# Patient Record
Sex: Female | Born: 1985
Health system: Southern US, Community
[De-identification: ages and names within clinical notes are randomized; demographics above are authoritative.]

## PROBLEM LIST (undated history)

## (undated) ENCOUNTER — Inpatient Hospital Stay (HOSPITAL_COMMUNITY): Payer: Self-pay

## (undated) DIAGNOSIS — Z87440 Personal history of urinary (tract) infections: Secondary | ICD-10-CM

## (undated) DIAGNOSIS — Z87442 Personal history of urinary calculi: Secondary | ICD-10-CM

## (undated) DIAGNOSIS — Z87448 Personal history of other diseases of urinary system: Secondary | ICD-10-CM

## (undated) DIAGNOSIS — B379 Candidiasis, unspecified: Secondary | ICD-10-CM

## (undated) DIAGNOSIS — F419 Anxiety disorder, unspecified: Secondary | ICD-10-CM

## (undated) DIAGNOSIS — Z8619 Personal history of other infectious and parasitic diseases: Secondary | ICD-10-CM

## (undated) HISTORY — DX: Personal history of urinary calculi: Z87.442

## (undated) HISTORY — PX: TONSILLECTOMY: SUR1361

## (undated) HISTORY — DX: Personal history of urinary (tract) infections: Z87.440

## (undated) HISTORY — DX: Anxiety disorder, unspecified: F41.9

## (undated) HISTORY — PX: WISDOM TOOTH EXTRACTION: SHX21

## (undated) HISTORY — DX: Personal history of other diseases of urinary system: Z87.448

## (undated) HISTORY — DX: Personal history of other infectious and parasitic diseases: Z86.19

## (undated) HISTORY — DX: Candidiasis, unspecified: B37.9

---
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
## 2010-05-23 NOTE — Op Note (Signed)
NAMEBLESSINGS, INGLETT               ACCOUNT NO.:  [id number]   MEDICAL RECORD NO.:  [id number]          PATIENT TYPE:  AMB   LOCATION:  DSC                          FACILITY:  [facility]   PHYSICIAN:  Onalee Hua L. Annalee Genta, M.D.DATE OF BIRTH:  1985-06-17   DATE OF PROCEDURE:  12/23/2006  DATE OF DISCHARGE:                               OPERATIVE REPORT   PRE AND POSTOPERATIVE DIAGNOSIS AND INDICATIONS FOR SURGERY:  1. Recurrent tonsillitis.  2. Tonsillar hypertrophy.  3. Chronic cryptic tonsillitis.   SURGICAL PROCEDURES:  Tonsillectomy.   ANESTHESIA:  General endotracheal.   SURGEON:  Kinnie Scales. Annalee Genta, M.D.   COMPLICATIONS:  None.   BLOOD LOSS:  Minimal.   The patient transferred from the operating room to recovery room in  stable condition.   BRIEF HISTORY:  Ersa is a 25 year old white female who is referred for  evaluation of recurrent tonsillitis.  The patient has had significant  problems with recurrent infections and has chronic tonsillar discharge,  halitosis and sore throat.  The patient has been treated with multiple  rounds of antibiotics over the last several years for recurrent  streptococcal tonsillitis.  Given her history and examination, I  recommended that we undertake tonsillectomy under general anesthesia.  The risks, benefits and possible complications of procedure were  discussed in detail with the patient and her mother and they understood  and concurred with our plan for surgery which was scheduled for December 23, 2006.   PROCEDURE:  The patient left the operating room on December 23, 2006 at  [facility] day [facility] center.  Placed in supine position on  the operating table.  General endotracheal anesthesia was established  without difficulty.  When the patient was adequately anesthetized, her  oral cavity, oropharynx were examined.  There were no loose or broken  teeth and the hard and soft palate were intact.  Crowe-Davis mouth gag  was  inserted without difficulty.  Surgical procedure was begun with  inspection of the nasopharynx.  There was no significant adenoidal  tissue and adenoidectomy was not performed.  Tonsillectomy was then  begun using Bovie electrocautery and dissecting in subcapsular fashion  along the left-hand side the entire left tonsil removed from superior  pole to tongue base.  The tonsil was removed in similar fashion and the  tonsillar fossa were gently abraded with a dry tonsil sponge.  A small  area of a point hemorrhage was cauterized with suction cautery.  The  oral cavity, oropharynx were irrigated and suctioned.  Crowe-Davis mouth  gag was released and reapplied.  There is no active bleeding.  An  orogastric tube was passed.  The stomach contents were aspirated.  Mouth  gag was released and removed.  No loose or broken teeth.  The patient  was awakened from anesthetic, extubated, was transferred from the  operating room to recovery in stable condition.  No complications.  Blood loss minimal.           ______________________________  Kinnie Scales. Annalee Genta, M.D.     DLS/MEDQ  D:  04/54/0981  T:  12/24/2006  Job:  [id number]

## 2010-10-13 LAB — I-STAT 8, (EC8 V) (CONVERTED LAB)
Acid-Base Excess: 2
BUN: 9
Bicarbonate: 26.3 — ABNORMAL HIGH
Chloride: 102
Glucose, Bld: 92
HCT: 40
Hemoglobin: 13.6
Operator id: 146091
Potassium: 4.2
Sodium: 135
TCO2: 27
pCO2, Ven: 37.7 — ABNORMAL LOW
pH, Ven: 7.451 — ABNORMAL HIGH

## 2010-10-13 LAB — URINALYSIS, ROUTINE W REFLEX MICROSCOPIC
Bilirubin Urine: NEGATIVE
Glucose, UA: NEGATIVE
Hgb urine dipstick: NEGATIVE
Ketones, ur: >80 — AB
Nitrite: NEGATIVE
Protein, ur: NEGATIVE
Specific Gravity, Urine: 1.026
Urobilinogen, UA: 1
pH: 7.5

## 2010-10-13 LAB — CBC
HCT: 35.8 — ABNORMAL LOW
Hemoglobin: 12.3
MCHC: 34.4
MCV: 86.9
Platelets: 200
RBC: 4.12
RDW: 12.5
WBC: 15.8 — ABNORMAL HIGH

## 2010-10-13 LAB — POCT I-STAT CREATININE
Creatinine, Ser: 0.9
Operator id: 146091

## 2010-10-13 LAB — DIFFERENTIAL
Basophils Absolute: 0.1
Basophils Relative: 0
Eosinophils Absolute: 0 — ABNORMAL LOW
Eosinophils Relative: 0
Lymphocytes Relative: 5 — ABNORMAL LOW
Lymphs Abs: 0.8
Monocytes Absolute: 1.2 — ABNORMAL HIGH
Monocytes Relative: 8
Neutro Abs: 13.7 — ABNORMAL HIGH
Neutrophils Relative %: 87 — ABNORMAL HIGH

## 2010-10-13 LAB — PREGNANCY, URINE: Preg Test, Ur: NEGATIVE

## 2010-10-13 LAB — POCT HEMOGLOBIN-HEMACUE
Hemoglobin: 14.1
Operator id: 123881

## 2011-01-09 NOTE — L&D Delivery Note (Signed)
Delivery Note Cx complete and +1 around 2045, and pushing started around 2048.  About half hr into pushing, intermittent variables w/ late component noted, and had pt push w/ some ctxs, and rest through others, along w/ varying tilted and lateral positions.  Pt pushed well to SVD at 11:09 PM.  A viable female "Elizabeth Garrison," was delivered via Vaginal, Spontaneous Delivery (Presentation: Right Occiput Anterior).  APGAR: 9, 9; weight 8 lb 8.9 oz (3880 g).  Meconium at delivery thicker, than noted while pushing.  No difficulty w/ shoulders, but body dystocia noted.  Poor tone as placed on mom's abdomen, but as dried and stimulated, newborn w/ lusty cry immediately, and cont'd transitioning well thereafter. Placenta status: Intact, Spontaneous, partial Duncan.  Cord: 3 vessels with the following complications: None.  Cord pH: not done.  Anesthesia: Epidural  Episiotomy: None Lacerations: bilateral Labial Suture Repair: 3-0 & 4-0 monocryl one stitch on Lt; 6 stitches on Rt (wider laceration than Lt) Est. Blood Loss (mL): 350  Mom to postpartum.  Baby to nursery-stable.  Samadhi Mahurin H 09/02/2011, 12:48 AM

## 2011-02-08 DIAGNOSIS — J45909 Unspecified asthma, uncomplicated: Secondary | ICD-10-CM | POA: Insufficient documentation

## 2011-02-08 DIAGNOSIS — F429 Obsessive-compulsive disorder, unspecified: Secondary | ICD-10-CM | POA: Insufficient documentation

## 2011-02-08 DIAGNOSIS — O09299 Supervision of pregnancy with other poor reproductive or obstetric history, unspecified trimester: Secondary | ICD-10-CM | POA: Insufficient documentation

## 2011-02-08 DIAGNOSIS — N209 Urinary calculus, unspecified: Secondary | ICD-10-CM | POA: Insufficient documentation

## 2011-02-08 LAB — OB RESULTS CONSOLE RUBELLA ANTIBODY, IGM: Rubella: IMMUNE

## 2011-02-08 LAB — OB RESULTS CONSOLE HEPATITIS B SURFACE ANTIGEN: Hepatitis B Surface Ag: NEGATIVE

## 2011-02-08 LAB — OB RESULTS CONSOLE RPR: RPR: NONREACTIVE

## 2011-02-08 LAB — OB RESULTS CONSOLE HIV ANTIBODY (ROUTINE TESTING): HIV: NONREACTIVE

## 2011-02-08 LAB — OB RESULTS CONSOLE ABO/RH: RH Type: POSITIVE

## 2011-02-08 LAB — OB RESULTS CONSOLE ANTIBODY SCREEN: Antibody Screen: NEGATIVE

## 2011-03-09 ENCOUNTER — Encounter (INDEPENDENT_AMBULATORY_CARE_PROVIDER_SITE_OTHER): Payer: Self-pay

## 2011-03-09 DIAGNOSIS — Z331 Pregnant state, incidental: Secondary | ICD-10-CM

## 2011-04-05 ENCOUNTER — Encounter (INDEPENDENT_AMBULATORY_CARE_PROVIDER_SITE_OTHER): Payer: BC Managed Care – PPO | Admitting: Obstetrics and Gynecology

## 2011-04-05 ENCOUNTER — Encounter (INDEPENDENT_AMBULATORY_CARE_PROVIDER_SITE_OTHER): Payer: BC Managed Care – PPO

## 2011-04-05 DIAGNOSIS — N390 Urinary tract infection, site not specified: Secondary | ICD-10-CM

## 2011-04-05 DIAGNOSIS — Z363 Encounter for antenatal screening for malformations: Secondary | ICD-10-CM

## 2011-04-05 DIAGNOSIS — Z331 Pregnant state, incidental: Secondary | ICD-10-CM

## 2011-04-05 DIAGNOSIS — Z1389 Encounter for screening for other disorder: Secondary | ICD-10-CM

## 2011-04-29 ENCOUNTER — Telehealth: Payer: Self-pay | Admitting: Advanced Practice Midwife

## 2011-04-29 NOTE — Telephone Encounter (Signed)
[redacted] weeks gestation. Reports generalized low abd pain right-sided LBP and sharp midline low abd pain since this morning. Moderate physical labor yesterday. She denies contractions, vaginal bleeding or leaking of fluid or Hx PTL/PTD. PTL precautions. Increase fluids, rest and take Ibuprofen 600 mg. CB if no improvement in 2-3 hours.

## 2011-04-30 ENCOUNTER — Telehealth: Payer: Self-pay | Admitting: Obstetrics and Gynecology

## 2011-04-30 NOTE — Telephone Encounter (Signed)
Routed to keshia °

## 2011-04-30 NOTE — Telephone Encounter (Signed)
PT CALLED IS 22 WKS, C/O ABD CRAMPINGTHAT STARTED YESTERDAY, HAVING PAIN THAT CAME IN INTERVALS, WAS NOT REGULAR, SO POSSIBLY BH CTXS, THEN THEY WENT AWAY, THIS AM WOKE UP, STARTED WITH CRAMPING AGAIN, STATES WAS UNCOMFORTABLE TO WALK, DENIES BLDG/LOF, PT ADMITS TO NOT DRINKING ENOUGH H20 AND NO MEDS OR COMFORT MEASURES TAKEN.  PT ADVISED TO PUSH FLUIDS AND CONTINUE DRINKING THROUGHOUT THE DAY, TRY TAKING IBUPROREN 600 MG Q6HRS OR TAKING TYLENOL AS DIRECTED, ALSO TRY TAKING WARM BATH, IF NO RELIEF AFTER THE AM, TO CALL BACK FOR APPT IN OFFICE, PT VOICES UNDERSTANDING.

## 2011-05-02 ENCOUNTER — Other Ambulatory Visit: Payer: Self-pay

## 2011-05-02 DIAGNOSIS — Z331 Pregnant state, incidental: Secondary | ICD-10-CM

## 2011-05-02 DIAGNOSIS — F429 Obsessive-compulsive disorder, unspecified: Secondary | ICD-10-CM

## 2011-05-02 DIAGNOSIS — O09299 Supervision of pregnancy with other poor reproductive or obstetric history, unspecified trimester: Secondary | ICD-10-CM

## 2011-05-02 DIAGNOSIS — N209 Urinary calculus, unspecified: Secondary | ICD-10-CM

## 2011-05-03 ENCOUNTER — Other Ambulatory Visit: Payer: BC Managed Care – PPO

## 2011-05-03 ENCOUNTER — Ambulatory Visit (INDEPENDENT_AMBULATORY_CARE_PROVIDER_SITE_OTHER): Payer: BC Managed Care – PPO

## 2011-05-03 ENCOUNTER — Ambulatory Visit (INDEPENDENT_AMBULATORY_CARE_PROVIDER_SITE_OTHER): Payer: BC Managed Care – PPO | Admitting: Obstetrics and Gynecology

## 2011-05-03 ENCOUNTER — Encounter: Payer: Self-pay | Admitting: Obstetrics and Gynecology

## 2011-05-03 ENCOUNTER — Other Ambulatory Visit: Payer: Self-pay | Admitting: Obstetrics and Gynecology

## 2011-05-03 ENCOUNTER — Encounter: Payer: BC Managed Care – PPO | Admitting: Obstetrics and Gynecology

## 2011-05-03 VITALS — BP 112/60 | Ht 63.0 in | Wt 133.0 lb

## 2011-05-03 DIAGNOSIS — Z331 Pregnant state, incidental: Secondary | ICD-10-CM

## 2011-05-03 LAB — US OB FOLLOW UP

## 2011-05-03 NOTE — Progress Notes (Signed)
Doing well. [place]: SIUP, Nl fluid, 23 4/7 (72%), Nl anatomy. Questions and concerns addressed. RTO 4 weeks.  AVS

## 2011-05-03 NOTE — Progress Notes (Signed)
pt c/o: contractions on Sunday and Monday that were apart   c/o:  a lot of pain and pressure so much pain that it was hard for her to walk or stand. Pt is concerned about weight gain. She is unsure about how much she should gain.

## 2011-05-14 ENCOUNTER — Telehealth: Payer: Self-pay | Admitting: Obstetrics and Gynecology

## 2011-05-14 NOTE — Telephone Encounter (Signed)
Triage received 

## 2011-05-14 NOTE — Telephone Encounter (Signed)
TC to pt. States is having increased sx of anxiety and OCD.   Was on medication but pt D/C'd when became pregnant.  States was told by psychiatrist could continue medication but wants to discuss with OB.  Denies suicidal or homicidal thoughts. Sched to discuss w/VL 05/15/11.

## 2011-05-15 ENCOUNTER — Ambulatory Visit (INDEPENDENT_AMBULATORY_CARE_PROVIDER_SITE_OTHER): Payer: BC Managed Care – PPO | Admitting: Obstetrics and Gynecology

## 2011-05-15 ENCOUNTER — Encounter: Payer: Self-pay | Admitting: Obstetrics and Gynecology

## 2011-05-15 VITALS — BP 100/64 | Wt 135.0 lb

## 2011-05-15 DIAGNOSIS — F329 Major depressive disorder, single episode, unspecified: Secondary | ICD-10-CM

## 2011-05-15 DIAGNOSIS — F489 Nonpsychotic mental disorder, unspecified: Secondary | ICD-10-CM

## 2011-05-15 DIAGNOSIS — F32A Depression, unspecified: Secondary | ICD-10-CM

## 2011-05-15 DIAGNOSIS — O99340 Other mental disorders complicating pregnancy, unspecified trimester: Secondary | ICD-10-CM

## 2011-05-15 MED ORDER — SERTRALINE HCL 25 MG PO TABS: 25.0000 mg | ORAL_TABLET | Freq: Every day | ORAL | Status: DC

## 2011-05-15 NOTE — Progress Notes (Signed)
Increased anxiety/depression--previous use of Prozac prior to pregnancy, elected to stop with pregnancy. Had been followed by psychiatrist, Dr. Tomasa Rand, but patient not happy with care. Issues reviewed.  Patient requests we start her on "something for her nerves and OCD".  Declines referral back to Dr. Tomasa Rand or other psychiatrist at present. Plan: Will start Zoloft 25 mg po q day x 7 days, then increase to 50 mg po q day.   Will re-evalute status at [facility] in 2 weeks.  If Zoloft appears ineffective, or if symptoms worsen, I advised patient we would need to refer at that time to psych provider.  Patient agreeable with that plan.

## 2011-05-15 NOTE — Progress Notes (Signed)
Pt requests medication for increased OCD & anxiety sx; no void yet drink given

## 2011-06-01 ENCOUNTER — Ambulatory Visit (INDEPENDENT_AMBULATORY_CARE_PROVIDER_SITE_OTHER): Payer: BC Managed Care – PPO | Admitting: Obstetrics and Gynecology

## 2011-06-01 ENCOUNTER — Encounter: Payer: Self-pay | Admitting: Obstetrics and Gynecology

## 2011-06-01 VITALS — BP 100/70 | Wt 138.0 lb

## 2011-06-01 DIAGNOSIS — Z331 Pregnant state, incidental: Secondary | ICD-10-CM

## 2011-06-01 LAB — HEMOGLOBIN: Hemoglobin: 11.3 g/dL — ABNORMAL LOW (ref 12.0–15.0)

## 2011-06-01 NOTE — Progress Notes (Signed)
[redacted]w[redacted]d [facility]  1hr today Denies ctx rec CBE, BF,   Pt declined to start zoloft, wants to begin PP, tho Pt states sx's of OCD are stable, has support at home, "knows what to do"  Will reevaluate NV

## 2011-06-01 NOTE — Progress Notes (Signed)
1 gtt given today without difficulty  

## 2011-06-01 NOTE — Patient Instructions (Signed)
Fetal Movement Counts Patient Name: __________________________________________________ Patient Due Date: ____________________ Kick counts is highly recommended in high risk pregnancies, but it is a good idea for every pregnant woman to do. Start counting fetal movements at 28 weeks of the pregnancy. Fetal movements increase after eating a full meal or eating or drinking something sweet (the blood sugar is higher). It is also important to drink plenty of fluids (well hydrated) before doing the count. Lie on your left side because it helps with the circulation or you can sit in a comfortable chair with your arms over your belly (abdomen) with no distractions around you. DOING THE COUNT  Try to do the count the same time of day each time you do it.   Mark the day and time, then see how long it takes for you to feel 10 movements (kicks, flutters, swishes, rolls). You should have at least 10 movements within 2 hours. You will most likely feel 10 movements in much less than 2 hours. If you do not, wait an hour and count again. After a couple of days you will see a pattern.   What you are looking for is a change in the pattern or not enough counts in 2 hours. Is it taking longer in time to reach 10 movements?  SEEK MEDICAL CARE IF:  You feel less than 10 counts in 2 hours. Tried twice.   No movement in one hour.   The pattern is changing or taking longer each day to reach 10 counts in 2 hours.   You feel the baby is not moving as it usually does.  Date: ____________ Movements: ____________ Start time: ____________ Finish time: ____________  Date: ____________ Movements: ____________ Start time: ____________ Finish time: ____________ Date: ____________ Movements: ____________ Start time: ____________ Finish time: ____________ Date: ____________ Movements: ____________ Start time: ____________ Finish time: ____________ Date: ____________ Movements: ____________ Start time: ____________ Finish time:  ____________ Date: ____________ Movements: ____________ Start time: ____________ Finish time: ____________ Date: ____________ Movements: ____________ Start time: ____________ Finish time: ____________ Date: ____________ Movements: ____________ Start time: ____________ Finish time: ____________  Date: ____________ Movements: ____________ Start time: ____________ Finish time: ____________ Date: ____________ Movements: ____________ Start time: ____________ Finish time: ____________ Date: ____________ Movements: ____________ Start time: ____________ Finish time: ____________ Date: ____________ Movements: ____________ Start time: ____________ Finish time: ____________ Date: ____________ Movements: ____________ Start time: ____________ Finish time: ____________ Date: ____________ Movements: ____________ Start time: ____________ Finish time: ____________ Date: ____________ Movements: ____________ Start time: ____________ Finish time: ____________  Date: ____________ Movements: ____________ Start time: ____________ Finish time: ____________ Date: ____________ Movements: ____________ Start time: ____________ Finish time: ____________ Date: ____________ Movements: ____________ Start time: ____________ Finish time: ____________ Date: ____________ Movements: ____________ Start time: ____________ Finish time: ____________ Date: ____________ Movements: ____________ Start time: ____________ Finish time: ____________ Date: ____________ Movements: ____________ Start time: ____________ Finish time: ____________ Date: ____________ Movements: ____________ Start time: ____________ Finish time: ____________  Date: ____________ Movements: ____________ Start time: ____________ Finish time: ____________ Date: ____________ Movements: ____________ Start time: ____________ Finish time: ____________ Date: ____________ Movements: ____________ Start time: ____________ Finish time: ____________ Date: ____________ Movements:  ____________ Start time: ____________ Finish time: ____________ Date: ____________ Movements: ____________ Start time: ____________ Finish time: ____________ Date: ____________ Movements: ____________ Start time: ____________ Finish time: ____________ Date: ____________ Movements: ____________ Start time: ____________ Finish time: ____________  Date: ____________ Movements: ____________ Start time: ____________ Finish time: ____________ Date: ____________ Movements: ____________ Start time: ____________ Finish time: ____________ Date: ____________ Movements: ____________ Start time:   ____________ Finish time: ____________ Date: ____________ Movements: ____________ Start time: ____________ Finish time: ____________ Date: ____________ Movements: ____________ Start time: ____________ Finish time: ____________ Date: ____________ Movements: ____________ Start time: ____________ Finish time: ____________ Date: ____________ Movements: ____________ Start time: ____________ Finish time: ____________  Date: ____________ Movements: ____________ Start time: ____________ Finish time: ____________ Date: ____________ Movements: ____________ Start time: ____________ Finish time: ____________ Date: ____________ Movements: ____________ Start time: ____________ Finish time: ____________ Date: ____________ Movements: ____________ Start time: ____________ Finish time: ____________ Date: ____________ Movements: ____________ Start time: ____________ Finish time: ____________ Date: ____________ Movements: ____________ Start time: ____________ Finish time: ____________ Date: ____________ Movements: ____________ Start time: ____________ Finish time: ____________  Date: ____________ Movements: ____________ Start time: ____________ Finish time: ____________ Date: ____________ Movements: ____________ Start time: ____________ Finish time: ____________ Date: ____________ Movements: ____________ Start time: ____________ Finish  time: ____________ Date: ____________ Movements: ____________ Start time: ____________ Finish time: ____________ Date: ____________ Movements: ____________ Start time: ____________ Finish time: ____________ Date: ____________ Movements: ____________ Start time: ____________ Finish time: ____________ Date: ____________ Movements: ____________ Start time: ____________ Finish time: ____________  Date: ____________ Movements: ____________ Start time: ____________ Finish time: ____________ Date: ____________ Movements: ____________ Start time: ____________ Finish time: ____________ Date: ____________ Movements: ____________ Start time: ____________ Finish time: ____________ Date: ____________ Movements: ____________ Start time: ____________ Finish time: ____________ Date: ____________ Movements: ____________ Start time: ____________ Finish time: ____________ Date: ____________ Movements: ____________ Start time: ____________ Finish time: ____________ Document Released: 01/24/2006 Document Revised: 12/14/2010 Document Reviewed: 07/27/2008 ExitCare Patient Information 2012 [employer].  Preventing Preterm Labor Preterm labor is when a pregnant woman has contractions that cause the cervix to open, shorten, and thin before 37 weeks of pregnancy. You will have regular contractions (tightening) 2 to 3 minutes apart. This usually causes discomfort or pain. HOME CARE  Eat a healthy diet.   Take your vitamins as told by your doctor.   Drink enough fluids to keep your pee (urine) clear or pale yellow every day.   Get rest and sleep.   Do not have sex if you are at high risk for preterm labor.   Follow your doctor's advice about activity, medicines, and tests.   Avoid stress.   Avoid hard labor or exercise that lasts for a long time.   Do not smoke.  GET HELP RIGHT AWAY IF:   You are having contractions.   You have belly (abdominal) pain.   You have bleeding from your vagina.   You  have pain when you pee (urinate).   You have abnormal discharge from your vagina.   You have a temperature by mouth above 102 F (38.9 C).  MAKE SURE YOU:  Understand these instructions.   Will watch your condition.   Will get help if you are not doing well or get worse.  Document Released: 03/23/2008 Document Revised: 12/14/2010 Document Reviewed: 03/23/2008 Exit[employer] Patient Information 2012 [employer]. 

## 2011-06-02 LAB — RPR

## 2011-06-02 LAB — GLUCOSE TOLERANCE, 1 HOUR: Glucose, 1 Hour GTT: 74 mg/dL (ref 70–140)

## 2011-06-12 ENCOUNTER — Encounter: Payer: Self-pay | Admitting: Obstetrics and Gynecology

## 2011-06-12 ENCOUNTER — Ambulatory Visit (INDEPENDENT_AMBULATORY_CARE_PROVIDER_SITE_OTHER): Payer: BC Managed Care – PPO | Admitting: Obstetrics and Gynecology

## 2011-06-12 VITALS — BP 90/60 | Wt 138.5 lb

## 2011-06-12 DIAGNOSIS — Z331 Pregnant state, incidental: Secondary | ICD-10-CM

## 2011-06-12 NOTE — Progress Notes (Signed)
C/o itching ankles  C/o "[place]orses" both legs at night

## 2011-06-12 NOTE — Progress Notes (Signed)
[redacted]w[redacted]d 1hr=74, RPR NR, hgb 11.3 Light sunburn on legs, c/o leg cramps, rec cal/mag supp, epsom salt baths Discussed diet, has had decreased appetite lately, is taking CPA  Exam this week  rv'd [facility]KC and normal FM rv'd PTL sx's

## 2011-06-12 NOTE — Patient Instructions (Signed)
High Protein Diet A high protein diet means that high protein foods are added to your diet. Getting more protein in the diet is important for a number of reasons. Protein helps the body to build tissue, muscle, and to repair damage. People who have had surgery, injuries such as broken bones, infections, and burns, or illnesses such as cancer, may need more protein in their diet.  SERVING SIZES Measuring foods and serving sizes helps to make sure you are getting the right amount of food. The list below tells how big or small some common serving sizes are.   1 oz.........4 stacked dice.   3 oz.........Deck of cards.   1 tsp........Tip of little finger.   1 tbs........Thumb.   2 tbs........Golf ball.    cup.......Half of a fist.   1 cup........A fist.  FOOD SOURCES OF PROTEIN Listed below are some food sources of protein and the amount of protein they contain. Your Registered Dietitian can calculate how many grams of protein you need for your medical condition. High protein foods can be added to the diet at mealtime or as snacks. Be sure to have at least 1 protein-containing food at each meal and snack to ensure adequate intake.  Meats and Meat Substitutes / Protein (g)  3 oz poultry (chicken, turkey) / 26 g   3 oz tuna, canned in water / 26 g   3 oz fish (cod) / 21 g   3 oz red meat (beef, pork) / 21 g   4 oz tofu / 9 g   1 egg / 6 g    cup egg substitute / 5 g   1 cup dried beans / 15 g   1 cup soy milk / 4 g  Dairy / Protein (g)  1 cup milk (skim, 1%, 2%, whole) / 8 g    cup evaporated milk / 9 g   1 cup buttermilk / 8 g   1 cup low-fat plain yogurt / 11 g   1 cup regular plain yogurt / 9 g    cup cottage cheese / 14 g   1 oz cheddar cheese / 7 g  Nuts / Protein (g)  2 tbs peanut butter / 8 g   1 oz peanuts / 7 g   2 tbs cashews / 5 g   2 tbs almonds / 5 g  Document Released: 12/25/2004 Document Revised: 12/14/2010 Document Reviewed:  09/27/2006 Exit[employer] Patient Information 2012 [employer]. 

## 2011-06-25 ENCOUNTER — Ambulatory Visit (INDEPENDENT_AMBULATORY_CARE_PROVIDER_SITE_OTHER): Payer: BC Managed Care – PPO | Admitting: Obstetrics and Gynecology

## 2011-06-25 ENCOUNTER — Encounter: Payer: Self-pay | Admitting: Obstetrics and Gynecology

## 2011-06-25 VITALS — BP 102/62 | Wt 140.0 lb

## 2011-06-25 DIAGNOSIS — M79609 Pain in unspecified limb: Secondary | ICD-10-CM

## 2011-06-25 DIAGNOSIS — M79661 Pain in right lower leg: Secondary | ICD-10-CM

## 2011-06-25 NOTE — Progress Notes (Signed)
Pt states the pain on the right calf is worse and wakes her up at night. LE  Right calf tenderness.  2 plus DTR B, left calf WNL Schedule doppler of RLE today FKC reviewed RT 2 weeks Increase water intake

## 2011-06-25 NOTE — Progress Notes (Signed)
Pt c/o cramping in calves during the night.

## 2011-07-01 ENCOUNTER — Telehealth: Payer: Self-pay | Admitting: Obstetrics and Gynecology

## 2011-07-01 NOTE — Telephone Encounter (Signed)
Offered may come to [facility] for evaluation and states will do kick count and f/o at [facility] if withourt 10 fm over 2 hour.  Lavera Guise, CNM

## 2011-07-02 ENCOUNTER — Telehealth: Payer: Self-pay | Admitting: Obstetrics and Gynecology

## 2011-07-02 ENCOUNTER — Inpatient Hospital Stay (HOSPITAL_COMMUNITY)
Admission: AD | Admit: 2011-07-02 | Discharge: 2011-07-02 | Disposition: A | Discharge status: Hospice | Payer: BC Managed Care – PPO | Source: Ambulatory Visit | Attending: Obstetrics and Gynecology | Admitting: Obstetrics and Gynecology

## 2011-07-02 ENCOUNTER — Encounter (HOSPITAL_COMMUNITY): Payer: Self-pay

## 2011-07-02 DIAGNOSIS — N949 Unspecified condition associated with female genital organs and menstrual cycle: Secondary | ICD-10-CM | POA: Insufficient documentation

## 2011-07-02 DIAGNOSIS — O368190 Decreased fetal movements, unspecified trimester, not applicable or unspecified: Secondary | ICD-10-CM

## 2011-07-02 LAB — URINALYSIS, ROUTINE W REFLEX MICROSCOPIC
Bilirubin Urine: NEGATIVE
Glucose, UA: NEGATIVE mg/dL
Hgb urine dipstick: NEGATIVE
Ketones, ur: NEGATIVE mg/dL
Nitrite: NEGATIVE
Protein, ur: NEGATIVE mg/dL
Specific Gravity, Urine: <1.005 — ABNORMAL LOW (ref 1.005–1.030)
Urobilinogen, UA: 0.2 mg/dL (ref 0.0–1.0)
pH: 6 (ref 5.0–8.0)

## 2011-07-02 LAB — URINE MICROSCOPIC-ADD ON

## 2011-07-02 NOTE — Telephone Encounter (Signed)
Pam/ob

## 2011-07-02 NOTE — MAU Provider Note (Signed)
History   26 yo G1p0 at 41 1/7 weeks presented after calling the office with ? Decreased FM last night, now normalized, and sporadic, sharp pain near cervix.  Denies leaking, bleeding, dysuria, contractions, or cramping.    Pregnancy remarkable for: Patient Active Problem List  Diagnosis  . Asthma  . OCD (obsessive compulsive disorder)  . FH of NTD (neural tube defect), currently pregnant  . Urolithiasis  . Pregnant state, incidental     Chief Complaint  Patient presents with  . Decreased Fetal Movement  . Vaginal pressure     OB History    Grav Para Term Preterm Abortions TAB SAB Ect Mult Living   1 0 0 0 0 0 0 0 0 0       Past Medical History  Diagnosis Date  . H/O rubella   . H/O varicella   . Asthma 02/08/11  . H/O cystitis   . H/O pyelonephritis     as adolescent  . Anxiety     as a teen  . History of kidney stones     age 89  . Yeast infection     Past Surgical History  Procedure Date  . Tonsillectomy     age 51  . Wisdom tooth extraction age 20    Family History  Problem Relation Age of Onset  . Hypertension Mother   . Hypertension Maternal Aunt   . Hypertension Maternal Grandmother   . Cancer Maternal Grandfather   . Cancer Paternal Grandmother     History  Substance Use Topics  . Smoking status: Never Smoker   . Smokeless tobacco: Never Used  . Alcohol Use: No    Allergies: No Known Allergies  Prescriptions prior to admission  Medication Sig Dispense Refill  . Prenatal Vit-Fe Fumarate-FA (PRENATAL MULTIVITAMIN) TABS Take 1 tablet by mouth daily.         Physical Exam   Blood pressure 133/81, pulse 93, temperature 99.2 F (37.3 C), temperature source Oral, resp. rate 16, height 5\' 3"  (1.6 m), weight 141 lb 6 oz (64.127 kg), last menstrual period 11/18/2010.  Chest clear Heart RRR without murmur Abd gravid, NT Pelvic Ext WNL  EFM reactive, no decels No UCs in initial 10 minutes   ED Course  IUP at 31 1/7 weeks Likely normal  discomforts of pregnancy Good FM  Plan: Continue to observe tracing at present. Check cervix.  Nigel Bridgeman, CNM, [place] 07/02/11 3:50pm  Addendum: Reactive NST, with much FM now perceived by patient. No UCs. Cervix long, closed, vtx -2 Bedside US shows lots of FM, largely of the torso, with fetus in an anterior position  Plan: Patient reassured regarding status. Comfort measures and FKCs reviewed. Keep scheduled appointment on Friday or call prn.  Nigel Bridgeman, CNM, [place] 07/02/11 4:05p

## 2011-07-02 NOTE — Telephone Encounter (Signed)
Pt called regarding decrease fetal movement on last pm. Pt stated that she called CNM on call and was advised to wait a specific time and to call back. Pt stated that the baby started moving and she evaluated kick counts. Pt started having bad pains thereafter with every movement from the baby. The pain was bad enough for her to cry. There were no available appt's in the office today, CNM VL was called and she advised pt to to come to the hospital, Pt voice was understanding although she stated that the pain had gotten better but still felt as though she should be evaluated and her husband was concerned. Mathis Bud

## 2011-07-02 NOTE — Discharge Instructions (Signed)
Fetal Monitoring, Fetal Movement Assessment Fetal movement assessment (FMA) is done by the pregnant woman herself by counting and recording the baby's movements over a certain time period. It is done to see if there are problems with the pregnancy and the baby. Identifying and correcting problems may prevent serious problems from developing with the fetus, including fetal loss. Some pregnancies are complicated by the mother's medical problems. Some of these problems are type 1 diabetes mellitus, high blood pressure and other chronic medical illnesses. This is why it is important to monitor the baby before birth.  OTHER TECHNIQUES OF MONITORING YOUR BABY BEFORE BIRTH Several tests are in use. These include:  Nonstress test (NST). This test monitors the baby's heart rate when the baby moves.   Contraction stress test (CST). This test monitors the baby's heart rate during a contraction of the uterus.   Fetal biophysical profile (BPP). This measures and evaluates 5 observations of the baby:   The nonstress test.   The baby's breathing.   The baby's movements.   The baby's muscle tone.   The amount of amniotic fluid.   Modified BPP. This measures the volume of fluid in different parts of the amniotic sac (amniotic fluid index) and the results of the nonstress test.   Umbilical artery doppler velocimetry. This evaluates the blood flow through the umbilical cord.  There are several very serious problems that cannot be predicted or detected with any of the fetal monitoring procedures. These problems include separation (abruption) of the placenta or when the fetus chokes on the umbilical cord (umbilical cord accident). Your caregiver will help you understand your tests and what they mean for you and your baby. It is your responsibility to obtain the results of your test. LET YOUR CAREGIVER KNOW ABOUT:   Any medications you are taking including prescription and over-the-counter drugs, herbs, eye  drops and creams.   If you have a fever.   If you have an infection.   If you are sick.  RISKS AND COMPLICATIONS  There are no risks or complications to the mother or fetus with FMA. BEFORE THE PROCEDURE  Do not take medications that may decrease or increase the baby's heart rate and/or movements.   Eat a full meal at least 2 hours before the test.   Do not smoke if you are pregnant. If you smoke, stop at least 2 days before the test. It is best not to smoke at all when you are pregnant.  PROCEDURE Sometimes, a mother notices her baby moves less before there are problems. Because of this, it is believed that fetal movement checking by the mother (kick counts) is a good way to check the baby before birth. There are different ways of doing this. Two good ways are:  The woman lies on her side and counts distinct (individual) fetal movements. A feeling of 10 distinct movements in a period of up to 2 hours is considered reassuring. When 10 movements are felt, you may stop counting.   Women are instructed to count fetal movements for 1 hour, three times per week. The count is good if, after one week, it equals or is over the woman's previously established baseline count. If the count is lower, further checking of your baby is needed.  AFTER THE PROCEDURE You may resume your usual activities. HOME CARE INSTRUCTIONS   Follow your caregiver's advice and recommendations.   Be aware of your baby's movements. Are they normal, less than usual or more than usual?     Make and keep the rest of your prenatal appointments.  SEEK MEDICAL CARE IF:   You develop a temperature of 100 F (37.8 C) or higher.   You have a bloody mucus discharge from the vagina (a bloody show).  SEEK IMMEDIATE MEDICAL CARE IF:   You do not feel the baby move.   You think the baby's movements are too little or too many.   You develop contractions.   You develop vaginal bleeding.   You have belly (abdominal) pain.    You have leaking or a gush of fluid from the vagina.  Document Released: 12/15/2001 Document Revised: 12/14/2010 Document Reviewed: 04/19/2008 Exit[employer] Patient Information 2012 [employer]. 

## 2011-07-02 NOTE — MAU Note (Signed)
When pt was placed on efm by another provider, the OBIX was not initiated properly and tracing was not recorded; paper tracing saved and to be archived from 1543 until 1557;

## 2011-07-02 NOTE — MAU Note (Signed)
Pt states last pm was having strong "cervical" pain. Did have decreased fm, now having +fm. Denies bleeding, did have to clench her body tight to not void on herself.

## 2011-07-06 ENCOUNTER — Encounter: Payer: BC Managed Care – PPO | Admitting: Obstetrics and Gynecology

## 2011-07-10 ENCOUNTER — Encounter: Payer: Self-pay | Admitting: Obstetrics and Gynecology

## 2011-07-10 ENCOUNTER — Ambulatory Visit (INDEPENDENT_AMBULATORY_CARE_PROVIDER_SITE_OTHER): Payer: BC Managed Care – PPO | Admitting: Obstetrics and Gynecology

## 2011-07-10 VITALS — BP 104/62 | Wt 146.0 lb

## 2011-07-10 DIAGNOSIS — Z3490 Encounter for supervision of normal pregnancy, unspecified, unspecified trimester: Secondary | ICD-10-CM

## 2011-07-10 DIAGNOSIS — Z331 Pregnant state, incidental: Secondary | ICD-10-CM

## 2011-07-10 NOTE — Progress Notes (Signed)
NO COMPLAINTS 

## 2011-07-10 NOTE — Progress Notes (Signed)
Doing well.  No further leg pain. Reviewed weight gain--will increase fluids and fruits/veggies.

## 2011-07-23 ENCOUNTER — Ambulatory Visit (INDEPENDENT_AMBULATORY_CARE_PROVIDER_SITE_OTHER): Payer: BC Managed Care – PPO | Admitting: Obstetrics and Gynecology

## 2011-07-23 ENCOUNTER — Encounter: Payer: Self-pay | Admitting: Obstetrics and Gynecology

## 2011-07-23 VITALS — BP 100/66 | Wt 147.0 lb

## 2011-07-23 DIAGNOSIS — O09299 Supervision of pregnancy with other poor reproductive or obstetric history, unspecified trimester: Secondary | ICD-10-CM

## 2011-07-23 DIAGNOSIS — Z331 Pregnant state, incidental: Secondary | ICD-10-CM

## 2011-07-23 NOTE — Progress Notes (Signed)
Patient ID: Elizabeth Garrison, female   DOB: Feb 11, 1985, 26 y.o.   MRN: [id number] Reviewed s/s preterm labor, srom, vag bleeding,daily kick counts to report, encouraged 8 water daily and frequent voids. Plans childbirth classes. F/O GBS Lavera Guise, CNM

## 2011-07-23 NOTE — Progress Notes (Signed)
No complaints per pt

## 2011-08-03 ENCOUNTER — Telehealth: Payer: Self-pay

## 2011-08-03 NOTE — Telephone Encounter (Signed)
msg from Danielle at [facility] calling in rgds to pt needing breast pump pt need rx for breast pump in order to get it call [phone] ext [phone] pt acct # [id number]

## 2011-08-06 ENCOUNTER — Ambulatory Visit (INDEPENDENT_AMBULATORY_CARE_PROVIDER_SITE_OTHER): Payer: BC Managed Care – PPO | Admitting: Obstetrics and Gynecology

## 2011-08-06 ENCOUNTER — Encounter: Payer: Self-pay | Admitting: Obstetrics and Gynecology

## 2011-08-06 VITALS — BP 110/68 | Wt 148.0 lb

## 2011-08-06 DIAGNOSIS — Z3490 Encounter for supervision of normal pregnancy, unspecified, unspecified trimester: Secondary | ICD-10-CM

## 2011-08-06 DIAGNOSIS — Z331 Pregnant state, incidental: Secondary | ICD-10-CM

## 2011-08-06 NOTE — Progress Notes (Signed)
C/o vag pain reqs cx check

## 2011-08-06 NOTE — Progress Notes (Signed)
[redacted]w[redacted]d Irregular cramps No change in vaginal secretions No complaints Today: GBS, GC/ Chlamydia - Done ROB x 1 week

## 2011-08-07 LAB — GC/CHLAMYDIA PROBE AMP, GENITAL
Chlamydia, DNA Probe: NEGATIVE
GC Probe Amp, Genital: NEGATIVE

## 2011-08-08 LAB — STREP B DNA PROBE: GBSP: NEGATIVE

## 2011-08-13 ENCOUNTER — Ambulatory Visit (INDEPENDENT_AMBULATORY_CARE_PROVIDER_SITE_OTHER): Payer: BC Managed Care – PPO | Admitting: Obstetrics and Gynecology

## 2011-08-13 VITALS — BP 100/66 | Wt 149.0 lb

## 2011-08-13 DIAGNOSIS — Z331 Pregnant state, incidental: Secondary | ICD-10-CM

## 2011-08-13 DIAGNOSIS — Z3490 Encounter for supervision of normal pregnancy, unspecified, unspecified trimester: Secondary | ICD-10-CM

## 2011-08-13 NOTE — Progress Notes (Signed)
Pt c/o increased vag pain reqs cx today

## 2011-08-13 NOTE — Progress Notes (Signed)
[redacted]w[redacted]d GBS: neg. C/o shooting type pains in vagina. Pregnancy belt advised to help with uterine weight. Requested V/e to assess. SVE: closed/80%/-2 ROb x 1 week

## 2011-08-20 ENCOUNTER — Ambulatory Visit (INDEPENDENT_AMBULATORY_CARE_PROVIDER_SITE_OTHER): Payer: BC Managed Care – PPO | Admitting: Obstetrics and Gynecology

## 2011-08-20 VITALS — BP 98/62 | Wt 151.0 lb

## 2011-08-20 DIAGNOSIS — Z3490 Encounter for supervision of normal pregnancy, unspecified, unspecified trimester: Secondary | ICD-10-CM

## 2011-08-20 DIAGNOSIS — Z331 Pregnant state, incidental: Secondary | ICD-10-CM

## 2011-08-20 NOTE — Progress Notes (Signed)
reqs cx check today

## 2011-08-20 NOTE — Progress Notes (Signed)
[redacted]w[redacted]d No change in vaginal secretions. No complaints. SVE: 0.5 /50%/-2 ROB x 1 week.

## 2011-08-27 ENCOUNTER — Ambulatory Visit (INDEPENDENT_AMBULATORY_CARE_PROVIDER_SITE_OTHER): Payer: BC Managed Care – PPO | Admitting: Obstetrics and Gynecology

## 2011-08-27 ENCOUNTER — Telehealth: Payer: Self-pay | Admitting: Obstetrics and Gynecology

## 2011-08-27 ENCOUNTER — Encounter: Payer: BC Managed Care – PPO | Admitting: Obstetrics and Gynecology

## 2011-08-27 ENCOUNTER — Encounter (HOSPITAL_COMMUNITY): Payer: Self-pay | Admitting: *Deleted

## 2011-08-27 ENCOUNTER — Encounter: Payer: Self-pay | Admitting: Obstetrics and Gynecology

## 2011-08-27 ENCOUNTER — Inpatient Hospital Stay (HOSPITAL_COMMUNITY)
Admission: AD | Admit: 2011-08-27 | Discharge: 2011-08-28 | Disposition: A | Discharge status: Home / Self Care | Payer: BC Managed Care – PPO | Source: Ambulatory Visit | Attending: Obstetrics and Gynecology | Admitting: Obstetrics and Gynecology

## 2011-08-27 VITALS — BP 116/78 | Wt 151.0 lb

## 2011-08-27 DIAGNOSIS — O479 False labor, unspecified: Secondary | ICD-10-CM | POA: Insufficient documentation

## 2011-08-27 DIAGNOSIS — R339 Retention of urine, unspecified: Secondary | ICD-10-CM

## 2011-08-27 DIAGNOSIS — O368190 Decreased fetal movements, unspecified trimester, not applicable or unspecified: Secondary | ICD-10-CM

## 2011-08-27 LAB — POCT URINALYSIS DIPSTICK
Bilirubin, UA: NEGATIVE
Blood, UA: NEGATIVE
Glucose, UA: NEGATIVE
Ketones, UA: NEGATIVE
Leukocytes, UA: NEGATIVE
Nitrite, UA: NEGATIVE
Protein, UA: NEGATIVE
Spec Grav, UA: 1.015
Urobilinogen, UA: NEGATIVE

## 2011-08-27 NOTE — MAU Note (Signed)
Contractions, denies bleeding or ROM 

## 2011-08-27 NOTE — Telephone Encounter (Signed)
Induction scheduled for 09/09/11 @ 7:30 with MK/ND. Elizabeth Garrison

## 2011-08-27 NOTE — Patient Instructions (Addendum)
Fetal Movement Counts Patient Name: __________________________________________________ Patient Due Date: ____________________ Kick counts is highly recommended in high risk pregnancies, but it is a good idea for every pregnant woman to do. Start counting fetal movements at 28 weeks of the pregnancy. Fetal movements increase after eating a full meal or eating or drinking something sweet (the blood sugar is higher). It is also important to drink plenty of fluids (well hydrated) before doing the count. Lie on your left side because it helps with the circulation or you can sit in a comfortable chair with your arms over your belly (abdomen) with no distractions around you. DOING THE COUNT  Try to do the count the same time of day each time you do it.   Mark the day and time, then see how long it takes for you to feel 10 movements (kicks, flutters, swishes, rolls). You should have at least 10 movements within 2 hours. You will most likely feel 10 movements in much less than 2 hours. If you do not, wait an hour and count again. After a couple of days you will see a pattern.   What you are looking for is a change in the pattern or not enough counts in 2 hours. Is it taking longer in time to reach 10 movements?  SEEK MEDICAL CARE IF:  You feel less than 10 counts in 2 hours. Tried twice.   No movement in one hour.   The pattern is changing or taking longer each day to reach 10 counts in 2 hours.   You feel the baby is not moving as it usually does.  Date: ____________ Movements: ____________ Start time: ____________ Finish time: ____________  Date: ____________ Movements: ____________ Start time: ____________ Finish time: ____________ Date: ____________ Movements: ____________ Start time: ____________ Finish time: ____________ Date: ____________ Movements: ____________ Start time: ____________ Finish time: ____________ Date: ____________ Movements: ____________ Start time: ____________ Finish time:  ____________ Date: ____________ Movements: ____________ Start time: ____________ Finish time: ____________ Date: ____________ Movements: ____________ Start time: ____________ Finish time: ____________ Date: ____________ Movements: ____________ Start time: ____________ Finish time: ____________  Date: ____________ Movements: ____________ Start time: ____________ Finish time: ____________ Date: ____________ Movements: ____________ Start time: ____________ Finish time: ____________ Date: ____________ Movements: ____________ Start time: ____________ Finish time: ____________ Date: ____________ Movements: ____________ Start time: ____________ Finish time: ____________ Date: ____________ Movements: ____________ Start time: ____________ Finish time: ____________ Date: ____________ Movements: ____________ Start time: ____________ Finish time: ____________ Date: ____________ Movements: ____________ Start time: ____________ Finish time: ____________  Date: ____________ Movements: ____________ Start time: ____________ Finish time: ____________ Date: ____________ Movements: ____________ Start time: ____________ Finish time: ____________ Date: ____________ Movements: ____________ Start time: ____________ Finish time: ____________ Date: ____________ Movements: ____________ Start time: ____________ Finish time: ____________ Date: ____________ Movements: ____________ Start time: ____________ Finish time: ____________ Date: ____________ Movements: ____________ Start time: ____________ Finish time: ____________ Date: ____________ Movements: ____________ Start time: ____________ Finish time: ____________  Date: ____________ Movements: ____________ Start time: ____________ Finish time: ____________ Date: ____________ Movements: ____________ Start time: ____________ Finish time: ____________ Date: ____________ Movements: ____________ Start time: ____________ Finish time: ____________ Date: ____________ Movements:  ____________ Start time: ____________ Finish time: ____________ Date: ____________ Movements: ____________ Start time: ____________ Finish time: ____________ Date: ____________ Movements: ____________ Start time: ____________ Finish time: ____________ Date: ____________ Movements: ____________ Start time: ____________ Finish time: ____________  Date: ____________ Movements: ____________ Start time: ____________ Finish time: ____________ Date: ____________ Movements: ____________ Start time: ____________ Finish time: ____________ Date: ____________ Movements: ____________ Start time:   ____________ Finish time: ____________ Date: ____________ Movements: ____________ Start time: ____________ Finish time: ____________ Date: ____________ Movements: ____________ Start time: ____________ Finish time: ____________ Date: ____________ Movements: ____________ Start time: ____________ Finish time: ____________ Date: ____________ Movements: ____________ Start time: ____________ Finish time: ____________  Date: ____________ Movements: ____________ Start time: ____________ Finish time: ____________ Date: ____________ Movements: ____________ Start time: ____________ Finish time: ____________ Date: ____________ Movements: ____________ Start time: ____________ Finish time: ____________ Date: ____________ Movements: ____________ Start time: ____________ Finish time: ____________ Date: ____________ Movements: ____________ Start time: ____________ Finish time: ____________ Date: ____________ Movements: ____________ Start time: ____________ Finish time: ____________ Date: ____________ Movements: ____________ Start time: ____________ Finish time: ____________  Date: ____________ Movements: ____________ Start time: ____________ Finish time: ____________ Date: ____________ Movements: ____________ Start time: ____________ Finish time: ____________ Date: ____________ Movements: ____________ Start time: ____________ Finish  time: ____________ Date: ____________ Movements: ____________ Start time: ____________ Finish time: ____________ Date: ____________ Movements: ____________ Start time: ____________ Finish time: ____________ Date: ____________ Movements: ____________ Start time: ____________ Finish time: ____________ Date: ____________ Movements: ____________ Start time: ____________ Finish time: ____________  Date: ____________ Movements: ____________ Start time: ____________ Finish time: ____________ Date: ____________ Movements: ____________ Start time: ____________ Finish time: ____________ Date: ____________ Movements: ____________ Start time: ____________ Finish time: ____________ Date: ____________ Movements: ____________ Start time: ____________ Finish time: ____________ Date: ____________ Movements: ____________ Start time: ____________ Finish time: ____________ Date: ____________ Movements: ____________ Start time: ____________ Finish time: ____________ Document Released: 01/24/2006 Document Revised: 12/14/2010 Document Reviewed: 07/27/2008 [employer]Care Patient Information 2012 [employer]. 

## 2011-08-27 NOTE — Progress Notes (Signed)
Pt c/o baby not moving as much as before. Pt states she can get the baby to move but he does not move on his own as much. She would like NST if possible. Pt also states she feels like she is unable to empty her bladder when using the restroom. Cx ck

## 2011-08-27 NOTE — Progress Notes (Signed)
A/P GBS negative Fetal kick counts reviewed Labor reviewed with pt All patients  questions answered Poor weight gain check for EFW@[place]  nst done for decreased fetal movement.  nst reactive

## 2011-08-27 NOTE — MAU Note (Signed)
PT SAYS UC HURT AT HOME .  WAS IN OFFICE TODAY- VE 1-2 CM AND STRIPPED MEMBRANES.     DENIES HSV AND MRSA.

## 2011-08-27 NOTE — Addendum Note (Signed)
Addended by: Mathis Bud on: 08/27/2011 10:16 AM   Modules accepted: Level of Service

## 2011-08-28 ENCOUNTER — Encounter (HOSPITAL_COMMUNITY): Payer: Self-pay | Admitting: *Deleted

## 2011-08-28 ENCOUNTER — Telehealth (HOSPITAL_COMMUNITY): Payer: Self-pay | Admitting: *Deleted

## 2011-08-28 NOTE — MAU Provider Note (Signed)
  History     CSN: [id number]  Arrival date and time: 08/27/11 2143   None     Chief Complaint  Patient presents with  . Labor Eval   HPI Comments: Pt is a 26yo G1P0 at [redacted]w[redacted]d that arrives after leaving message w on call provider, c/o regular ctx, has been timing them since about 7pm and were 4 min apart now closer to , some stronger, denies any VB, LOF, reports GFM. Seen in office today and had reactive NST and membranes swept      Past Medical History  Diagnosis Date  . H/O rubella   . H/O varicella   . Asthma 02/08/11  . H/O cystitis   . H/O pyelonephritis     as adolescent  . Anxiety     as a teen  . History of kidney stones     age 51  . Yeast infection     Past Surgical History  Procedure Date  . Tonsillectomy     age 45  . Wisdom tooth extraction age 52    Family History  Problem Relation Age of Onset  . Hypertension Mother   . Hypertension Maternal Aunt   . Hypertension Maternal Grandmother   . Cancer Maternal Grandfather   . Cancer Paternal Grandmother     History  Substance Use Topics  . Smoking status: Never Smoker   . Smokeless tobacco: Never Used  . Alcohol Use: No    Allergies: No Known Allergies  Prescriptions prior to admission  Medication Sig Dispense Refill  . calcium carbonate (TUMS - DOSED IN MG ELEMENTAL CALCIUM) 500 MG chewable tablet Chew 2 tablets by mouth at bedtime as needed. For heartburn      . diphenhydrAMINE (BENADRYL) 25 MG tablet Take 25 mg by mouth daily as needed. For sleep      . Diphenhydramine-APAP, sleep, (TYLENOL PM EXTRA STRENGTH PO) Take by mouth at bedtime.      . Prenatal Vit-Fe Fumarate-FA (PRENATAL MULTIVITAMIN) TABS Take 1 tablet by mouth daily.        Review of Systems  All other systems reviewed and are negative.   Physical Exam   Blood pressure 144/87, pulse 102, temperature 98.6 F (37 C), temperature source Oral, resp. rate 18, height 5\' 3"  (1.6 m), weight 152 lb (68.947 kg), last menstrual  period 11/18/2010, SpO2 100.00%.  Physical Exam  Nursing note and vitals reviewed. Constitutional: She is oriented to person, place, and time. She appears well-developed and well-nourished.  HENT:  Head: Normocephalic.  Neck: Normal range of motion.  Cardiovascular: Normal rate, regular rhythm and normal heart sounds.   Respiratory: Effort normal and breath sounds normal.  GI: Soft.  Genitourinary: Vagina normal.  Musculoskeletal: Normal range of motion.  Neurological: She is alert and oriented to person, place, and time.  Skin: Skin is warm and dry.  Psychiatric: She has a normal mood and affect. Her behavior is normal.    MAU Course  Procedures    Assessment and Plan  IUP at [redacted]w[redacted]d FHR reassuring GBS neg No cervical change from office   D/C home w [facility] and labor precautions F/u office as scheduled next week or PRN sx's   Seymone Forlenza M 08/28/2011, 12:15 AM

## 2011-08-28 NOTE — Telephone Encounter (Signed)
Preadmission screen  

## 2011-08-28 NOTE — Telephone Encounter (Signed)
Called pt to advise that there were no available apt this week. Advised to keep scheduled apt for u/s and Dr. Estanislado Pandy for Monday 09/03/11. Pt voice understanding.

## 2011-08-31 ENCOUNTER — Telehealth: Payer: Self-pay | Admitting: Obstetrics and Gynecology

## 2011-08-31 NOTE — Telephone Encounter (Signed)
TRIAGE/OB °

## 2011-08-31 NOTE — Telephone Encounter (Signed)
To to pt per telephone call. Pt states,"mucus plug (tinged with blood) was expelled on 08/28/11 and 08/29/11. Pt had not noticed anything more discharge on 08/30/11. Pt c/o mucous discharge today:however has more blood in discharge than before". Positive fetal movement. Irregular contractions. No recent IC within 24-48 hours. Pt states', bleeding has pretty much subsided now". Consulted with Elizabeth Garrison, pt to monitor bldg and cb if increases, increase water intake(64oz daily), and discussed fetal kick counts. Pt voices understanding. Pt with ROB appt on 09/03/11.

## 2011-09-01 ENCOUNTER — Encounter (HOSPITAL_COMMUNITY): Payer: Self-pay | Admitting: Anesthesiology

## 2011-09-01 ENCOUNTER — Telehealth: Payer: Self-pay | Admitting: Obstetrics and Gynecology

## 2011-09-01 ENCOUNTER — Encounter (HOSPITAL_COMMUNITY): Payer: Self-pay | Admitting: Emergency Medicine

## 2011-09-01 ENCOUNTER — Inpatient Hospital Stay (HOSPITAL_COMMUNITY): Payer: BC Managed Care – PPO | Admitting: Anesthesiology

## 2011-09-01 ENCOUNTER — Inpatient Hospital Stay (HOSPITAL_COMMUNITY)
Admission: AD | Admit: 2011-09-01 | Discharge: 2011-09-03 | DRG: 373 | Disposition: A | Discharge status: Home / Self Care | Payer: BC Managed Care – PPO | Source: Ambulatory Visit | Attending: Obstetrics and Gynecology | Admitting: Obstetrics and Gynecology

## 2011-09-01 DIAGNOSIS — O9903 Anemia complicating the puerperium: Secondary | ICD-10-CM | POA: Diagnosis not present

## 2011-09-01 DIAGNOSIS — Z87448 Personal history of other diseases of urinary system: Secondary | ICD-10-CM | POA: Diagnosis not present

## 2011-09-01 DIAGNOSIS — IMO0001 Reserved for inherently not codable concepts without codable children: Secondary | ICD-10-CM

## 2011-09-01 DIAGNOSIS — D649 Anemia, unspecified: Secondary | ICD-10-CM | POA: Diagnosis not present

## 2011-09-01 LAB — CBC
HCT: 36.3 % (ref 36.0–46.0)
Hemoglobin: 12.1 g/dL (ref 12.0–15.0)
MCH: 28.7 pg (ref 26.0–34.0)
MCHC: 33.3 g/dL (ref 30.0–36.0)
MCV: 86.2 fL (ref 78.0–100.0)
Platelets: 202 10*3/uL (ref 150–400)
RBC: 4.21 MIL/uL (ref 3.87–5.11)
RDW: 13.4 % (ref 11.5–15.5)
WBC: 10.5 10*3/uL (ref 4.0–10.5)

## 2011-09-01 LAB — RPR: RPR Ser Ql: NONREACTIVE

## 2011-09-01 MED ORDER — IBUPROFEN 600 MG PO TABS: 600.0000 mg | ORAL_TABLET | Freq: Four times a day (QID) | ORAL | Status: DC | PRN

## 2011-09-01 MED ORDER — LACTATED RINGERS IV SOLN: 500.0000 mL | INTRAVENOUS | Status: DC | PRN

## 2011-09-01 MED ORDER — EPHEDRINE 5 MG/ML INJ: 10.0000 mg | INTRAVENOUS | Status: DC | PRN

## 2011-09-01 MED ORDER — FENTANYL 2.5 MCG/ML BUPIVACAINE 1/10 % EPIDURAL INFUSION (WH - ANES)
14.0000 mL/h | INTRAMUSCULAR | Status: DC
  Administered 2011-09-01 (×2): 14 mL/h via EPIDURAL
  Filled 2011-09-01 (×3): qty 60

## 2011-09-01 MED ORDER — ONDANSETRON HCL 4 MG/2ML IJ SOLN: 4.0000 mg | Freq: Four times a day (QID) | INTRAMUSCULAR | Status: DC | PRN

## 2011-09-01 MED ORDER — LACTATED RINGERS IV SOLN
INTRAVENOUS | Status: DC
  Administered 2011-09-01 (×2): via INTRAVENOUS

## 2011-09-01 MED ORDER — LIDOCAINE HCL (PF) 1 % IJ SOLN
INTRAMUSCULAR | Status: DC | PRN
  Administered 2011-09-01 (×2): 4 mL

## 2011-09-01 MED ORDER — LACTATED RINGERS IV SOLN: 500.0000 mL | Freq: Once | INTRAVENOUS | Status: DC

## 2011-09-01 MED ORDER — ACETAMINOPHEN 325 MG PO TABS: 650.0000 mg | ORAL_TABLET | ORAL | Status: DC | PRN

## 2011-09-01 MED ORDER — OXYTOCIN BOLUS FROM INFUSION
250.0000 mL | Freq: Once | INTRAVENOUS | Status: DC
  Filled 2011-09-01: qty 500

## 2011-09-01 MED ORDER — OXYTOCIN 40 UNITS IN LACTATED RINGERS INFUSION - SIMPLE MED
62.5000 mL/h | Freq: Once | INTRAVENOUS | Status: DC
  Filled 2011-09-01: qty 1000

## 2011-09-01 MED ORDER — FENTANYL 2.5 MCG/ML BUPIVACAINE 1/10 % EPIDURAL INFUSION (WH - ANES)
INTRAMUSCULAR | Status: DC | PRN
  Administered 2011-09-01: 14 mL/h via EPIDURAL

## 2011-09-01 MED ORDER — LIDOCAINE HCL (PF) 1 % IJ SOLN
30.0000 mL | INTRAMUSCULAR | Status: DC | PRN
  Filled 2011-09-01: qty 30

## 2011-09-01 MED ORDER — EPHEDRINE 5 MG/ML INJ
10.0000 mg | INTRAVENOUS | Status: DC | PRN
  Filled 2011-09-01: qty 4

## 2011-09-01 MED ORDER — CITRIC ACID-SODIUM CITRATE 334-500 MG/5ML PO SOLN: 30.0000 mL | ORAL | Status: DC | PRN

## 2011-09-01 MED ORDER — OXYCODONE-ACETAMINOPHEN 5-325 MG PO TABS: 1.0000 | ORAL_TABLET | ORAL | Status: DC | PRN

## 2011-09-01 MED ORDER — PHENYLEPHRINE 40 MCG/ML (10ML) SYRINGE FOR IV PUSH (FOR BLOOD PRESSURE SUPPORT)
80.0000 ug | PREFILLED_SYRINGE | INTRAVENOUS | Status: DC | PRN
  Filled 2011-09-01: qty 5

## 2011-09-01 MED ORDER — DIPHENHYDRAMINE HCL 50 MG/ML IJ SOLN
12.5000 mg | INTRAMUSCULAR | Status: DC | PRN
  Administered 2011-09-01: 12.5 mg via INTRAVENOUS
  Filled 2011-09-01: qty 1

## 2011-09-01 MED ORDER — PHENYLEPHRINE 40 MCG/ML (10ML) SYRINGE FOR IV PUSH (FOR BLOOD PRESSURE SUPPORT): 80.0000 ug | PREFILLED_SYRINGE | INTRAVENOUS | Status: DC | PRN

## 2011-09-01 MED ORDER — BUTORPHANOL TARTRATE 1 MG/ML IJ SOLN: 1.0000 mg | INTRAMUSCULAR | Status: DC | PRN

## 2011-09-01 MED ORDER — FLEET ENEMA 7-19 GM/118ML RE ENEM: 1.0000 | ENEMA | RECTAL | Status: DC | PRN

## 2011-09-01 NOTE — Telephone Encounter (Signed)
TC from patient--UCs still q 5 min, still painful. +FM, no leaking or bleeding.  Status reviewed--discussed again with patient option of evaluation now or waiting for more time.  Patient will CTO at present, will come prn.

## 2011-09-01 NOTE — Progress Notes (Signed)
  Subjective: Received epidural with benefit, but now has hot spot on right side.  Objective: BP 110/53  Pulse 80  Temp 98.3 F (36.8 C) (Oral)  Resp 20  SpO2 99%  LMP 11/18/2010   Total I/O In: -  Out: 300 [Urine:300]  FHT:  Category 1 UC:   regular, every 3 minutes SVE:   Dilation: 5.5 Effacement (%): 80 Station: -1 Exam by:: Chip Boer SROM noted by patient just prior to exam, moderate MSF noted.  Assessment / Plan: Progressive labor MSF Epidural hot spot Will reposition, redose with PCA. Place foley.  Nigel Bridgeman 09/01/2011, 5:14 PM

## 2011-09-01 NOTE — Anesthesia Preprocedure Evaluation (Signed)
Anesthesia Evaluation  Patient identified by MRN, date of birth, ID band Patient awake    Reviewed: Allergy & Precautions, H&P , Patient's Chart, lab work & pertinent test results  Airway Mallampati: III TM Distance: >3 FB Neck ROM: full    Dental No notable dental hx. (+) Teeth Intact   Pulmonary asthma ,  breath sounds clear to auscultation  Pulmonary exam normal       Cardiovascular negative cardio ROS  Rhythm:regular Rate:Normal     Neuro/Psych Anxiety OCDnegative neurological ROS     GI/Hepatic negative GI ROS, Neg liver ROS,   Endo/Other  negative endocrine ROS  Renal/GU Hx/o Renal Calculi  negative genitourinary   Musculoskeletal   Abdominal Normal abdominal exam  (+)   Peds  Hematology negative hematology ROS (+)   Anesthesia Other Findings   Reproductive/Obstetrics (+) Pregnancy                           Anesthesia Physical Anesthesia Plan  ASA: II  Anesthesia Plan: Epidural   Post-op Pain Management:    Induction:   Airway Management Planned:   Additional Equipment:   Intra-op Plan:   Post-operative Plan:   Informed Consent: I have reviewed the patients History and Physical, chart, labs and discussed the procedure including the risks, benefits and alternatives for the proposed anesthesia with the patient or authorized representative who has indicated his/her understanding and acceptance.     Plan Discussed with: Anesthesiologist  Anesthesia Plan Comments:         Anesthesia Quick Evaluation

## 2011-09-01 NOTE — Telephone Encounter (Signed)
Entered in error

## 2011-09-01 NOTE — H&P (Signed)
Elizabeth Garrison is a 26 y.o. female, G1P0 at 39 6/7 weeks, presenting for increasing contractions today.  Had spoken with me twice this am regarding gradually worsening UCs since awakening this am at 5:30am.  Denies leaking or bleeding, reports +FM.  History Patient Active Problem List  Diagnosis  . Asthma  . OCD (obsessive compulsive disorder)  . FH of NTD (neural tube defect), currently pregnant  . Urolithiasis  . Pregnant state, incidental  . Active labor  . H/O pyelonephritis as teen   History of present pregnancy: Patient entered care at 11 weeks.  EDC of 09/02/11 was established by LMP and was in agreement with 18 week [place].  Anatomy scan was done at 18 weeks, with normal findings, limited spine views, and an anterior placenta.  Further ultrasounds were done at  22 weeks, with completion of anatomy views then. She had doppler flow study of right leg at 32 weeks due to pain--these were negative.  She reported increased issues with anxiety/depression at 24 weeks and was prescribed Zoloft.  She declined referral to therapist.  She subsequently declined starting Zoloft during pregnancy, but is interested in starting pp.   She was seen in MAU on 8/20 for labor check, with cervix FT, 50%.  OB History    Grav Para Term Preterm Abortions TAB SAB Ect Mult Living   1 0 0 0 0 0 0 0 0 0      Past Medical History  Diagnosis Date  . H/O varicella   . Asthma 02/08/11  . H/O cystitis   . H/O pyelonephritis     as adolescent  . Anxiety     as a teen  . History of kidney stones     age 18  . Yeast infection    Past Surgical History  Procedure Date  . Tonsillectomy     age 59  . Wisdom tooth extraction age 3   Family History: family history includes Asthma in her brother and mother; Cancer in her maternal grandfather and paternal grandmother; Hypertension in her maternal grandmother, maternal uncle, and mother; Other in her mother; and Thyroid disease in her maternal uncle and  mother.  Social History:  reports that she has never smoked. She has never used smokeless tobacco. She reports that she does not drink alcohol or use illicit drugs. FOB is involved and supportive Apolinar Junes).  Patient is college educated, is currently a IT consultant, of the [employer], and Caucasian.  FOB is college-educated and employed as a Emergency planning/management officer.  Patient had OCD and anxiety diagnosed as a teen--was on Prozac prior to pregnancy, but stopped with pregnancy.  She was followed by Dr. Tomasa Rand as psychiatrist, but was not following up with that office.  ROS: Contractions, +FM  Dilation: 3 Effacement (%): 70 Exam by:: Erin Sons CNM Cervix anterior, vtx, -1  Last menstrual period 11/18/2010. Exam Physical Exam  Chest clear Heart RRR without murmur Abd gravid, NT Pelvic--as above Ext WNL  FHR reactive, no decels UCs q 3 min, moderate  Prenatal labs: ABO, Rh: A/Positive/-- (01/31 0000) Antibody: Negative (01/31 0000) Rubella: Immune (01/31 0000) RPR: NON REAC (05/24 1115)  HBsAg: Negative (01/31 0000)  HIV: Non-reactive (01/31 0000)  GBS: NEGATIVE (07/29 0907)  Hgb 12.2 at NOB/11.3 at 28 weeks Glucola WNL Cultures negative at 36 weeks.  Assessment/Plan: IUP at 39 6/7 weeks Early labor GBS negative Hx depression--declined Zoloft until pp.  Plan: Admitted to [facility] per consult with Dr. Stefano Gaul Routine [facility] orders  Patient likely will plan epidural. Plan initiation of Zoloft pp.  [name] 09/01/2011, 1:19 PM

## 2011-09-01 NOTE — Progress Notes (Signed)
  Subjective: Now on Birthing Suite--on birthing ball at present.  Anticipates using pain medication soon (=/- epidural). Family at bedside.  Objective: BP 139/89  Pulse 85  Temp 98.3 F (36.8 C) (Oral)  LMP 11/18/2010      FHT: Category 1 UC:   regular, every 3 minutes  Labs: Lab Results  Component Value Date   WBC 10.5 09/01/2011   HGB 12.1 09/01/2011   HCT 36.3 09/01/2011   MCV 86.2 09/01/2011   PLT 202 09/01/2011    Assessment / Plan: Early labor Will continue to observe--may ambulate as desired. Pain med prn.  Nigel Bridgeman 09/01/2011, 2:01 PM

## 2011-09-01 NOTE — MAU Note (Signed)
Pt presents with complaints of contractions since 6 am. Denies any vaginal bleeding or leakage of fluid. States baby is active.

## 2011-09-01 NOTE — Progress Notes (Signed)
  Subjective: Feeling some pressure, but overall comfortable.  Objective: BP 113/69  Pulse 89  Temp 98.5 F (36.9 C) (Oral)  Resp 18  SpO2 99%  LMP 11/18/2010 I/O last 3 completed shifts: In: -  Out: 300 [Urine:300]    FHT:  Category 1 UC:   regular, every 2 minutes SVE:   Dilation: Lip/rim Effacement (%): 80 Station: -1 Exam by:: Nigel Bridgeman CNM Vtx at 0/+1 station   Assessment / Plan: Transition labor Will continue to observe--report to [facility], CNM, [place], Chip Boer 09/01/2011, 7:21 PM

## 2011-09-01 NOTE — Anesthesia Procedure Notes (Signed)
Epidural Patient location during procedure: OB Start time: 09/01/2011 3:31 PM  Staffing Anesthesiologist: Korine Winton A. Performed by: anesthesiologist   Preanesthetic Checklist Completed: patient identified, site marked, surgical consent, pre-op evaluation, timeout performed, IV checked, risks and benefits discussed and monitors and equipment checked  Epidural Patient position: sitting Prep: site prepped and draped and DuraPrep Patient monitoring: continuous pulse ox and blood pressure Approach: midline Injection technique: LOR air  Needle:  Needle type: Tuohy  Needle gauge: 17 G Needle length: 9 cm Needle insertion depth: 5 cm cm Catheter type: closed end flexible Catheter size: 19 Gauge Catheter at skin depth: 10 cm Test dose: negative and Other  Assessment Events: blood not aspirated, injection not painful, no injection resistance, negative IV test and no paresthesia  Additional Notes Patient identified. Risks and benefits discussed including failed block, incomplete  Pain control, post dural puncture headache, nerve damage, paralysis, blood pressure Changes, nausea, vomiting, reactions to medications-both toxic and allergic and post Partum back pain. All questions were answered. Patient expressed understanding and wished to proceed. Sterile technique was used throughout procedure. Epidural site was Dressed with sterile barrier dressing. No paresthesias, signs of intravascular injection Or signs of intrathecal spread were encountered.  Patient was more comfortable after the epidural was dosed. Please see RN's note for documentation of vital signs and FHR which are stable.

## 2011-09-01 NOTE — Progress Notes (Signed)
  Subjective: Requesting pain medication--crying out with UCs.  Objective: BP 111/81  Pulse 69  Temp 98.3 F (36.8 C) (Oral)  LMP 11/18/2010      FHT:  Reassuring on intermittent auscultation UC:   regular, every 3 minutes SVE:   3-4 cm, 75%, vtx, -1, cervix posterior   Assessment / Plan: Reviewed options for pain medication.  Patient wants to proceed with epidural.  Malajah Garrison 09/01/2011, 3:02 PM

## 2011-09-01 NOTE — Telephone Encounter (Signed)
TC from patient--39 6/7 weeks, G1P0, contracting q 5 minutes x 1 1/2 hours, more painful than previous contractions.  Seen in MAU 08/28/11 with cervix FT, 50%, vtx -2 ("no change from office"). +FM, small amount bloody mucus sporadically this week  Doesn't want to come in if she will be sent home, due to finances, etc. Long discussion with patient and husband, reviewed status. Offered evaluation at any point, but patient could also get in tub and re-evaluate in 1 hour. Patient elects to wait x 1 hour--will call back with update then.  Nigel Bridgeman, CNM 09/01/11 10:43a

## 2011-09-02 ENCOUNTER — Encounter (HOSPITAL_COMMUNITY): Payer: Self-pay | Admitting: *Deleted

## 2011-09-02 LAB — CBC
HCT: 32 % — ABNORMAL LOW (ref 36.0–46.0)
Hemoglobin: 10.8 g/dL — ABNORMAL LOW (ref 12.0–15.0)
MCH: 29.3 pg (ref 26.0–34.0)
MCHC: 33.8 g/dL (ref 30.0–36.0)
MCV: 87 fL (ref 78.0–100.0)
Platelets: 183 10*3/uL (ref 150–400)
RBC: 3.68 MIL/uL — ABNORMAL LOW (ref 3.87–5.11)
RDW: 13.4 % (ref 11.5–15.5)
WBC: 16.2 10*3/uL — ABNORMAL HIGH (ref 4.0–10.5)

## 2011-09-02 MED ORDER — SENNOSIDES-DOCUSATE SODIUM 8.6-50 MG PO TABS
2.0000 | ORAL_TABLET | Freq: Every day | ORAL | Status: DC
  Administered 2011-09-02: 2 via ORAL

## 2011-09-02 MED ORDER — WITCH HAZEL-GLYCERIN EX PADS: 1.0000 "application " | MEDICATED_PAD | CUTANEOUS | Status: DC | PRN

## 2011-09-02 MED ORDER — ONDANSETRON HCL 4 MG/2ML IJ SOLN: 4.0000 mg | INTRAMUSCULAR | Status: DC | PRN

## 2011-09-02 MED ORDER — OXYCODONE-ACETAMINOPHEN 5-325 MG PO TABS
1.0000 | ORAL_TABLET | ORAL | Status: DC | PRN
  Administered 2011-09-02 – 2011-09-03 (×5): 1 via ORAL
  Filled 2011-09-02 (×5): qty 1

## 2011-09-02 MED ORDER — IBUPROFEN 600 MG PO TABS
600.0000 mg | ORAL_TABLET | Freq: Four times a day (QID) | ORAL | Status: DC
  Administered 2011-09-02 – 2011-09-03 (×5): 600 mg via ORAL
  Administered 2011-09-03: 06:00:00 via ORAL
  Filled 2011-09-02 (×6): qty 1

## 2011-09-02 MED ORDER — SIMETHICONE 80 MG PO CHEW: 80.0000 mg | CHEWABLE_TABLET | ORAL | Status: DC | PRN

## 2011-09-02 MED ORDER — ONDANSETRON HCL 4 MG PO TABS: 4.0000 mg | ORAL_TABLET | ORAL | Status: DC | PRN

## 2011-09-02 MED ORDER — ACETAMINOPHEN 500 MG PO TABS: 1000.0000 mg | ORAL_TABLET | Freq: Four times a day (QID) | ORAL | Status: DC | PRN

## 2011-09-02 MED ORDER — LANOLIN HYDROUS EX OINT: TOPICAL_OINTMENT | CUTANEOUS | Status: DC | PRN

## 2011-09-02 MED ORDER — PRENATAL MULTIVITAMIN CH
1.0000 | ORAL_TABLET | Freq: Every day | ORAL | Status: DC
  Administered 2011-09-02 – 2011-09-03 (×2): 1 via ORAL
  Filled 2011-09-02 (×3): qty 1

## 2011-09-02 MED ORDER — DIBUCAINE 1 % RE OINT: 1.0000 "application " | TOPICAL_OINTMENT | RECTAL | Status: DC | PRN

## 2011-09-02 MED ORDER — TETANUS-DIPHTH-ACELL PERTUSSIS 5-2.5-18.5 LF-MCG/0.5 IM SUSP
0.5000 mL | Freq: Once | INTRAMUSCULAR | Status: AC
  Administered 2011-09-03: 0.5 mL via INTRAMUSCULAR
  Filled 2011-09-02: qty 0.5

## 2011-09-02 MED ORDER — MAGNESIUM HYDROXIDE 400 MG/5ML PO SUSP: 30.0000 mL | ORAL | Status: DC | PRN

## 2011-09-02 MED ORDER — BENZOCAINE-MENTHOL 20-0.5 % EX AERO
1.0000 "application " | INHALATION_SPRAY | CUTANEOUS | Status: DC | PRN
  Administered 2011-09-03: 1 via TOPICAL
  Filled 2011-09-02 (×2): qty 56

## 2011-09-02 MED ORDER — ZOLPIDEM TARTRATE 5 MG PO TABS: 5.0000 mg | ORAL_TABLET | Freq: Every evening | ORAL | Status: DC | PRN

## 2011-09-02 MED ORDER — DIPHENHYDRAMINE HCL 25 MG PO CAPS: 25.0000 mg | ORAL_CAPSULE | Freq: Four times a day (QID) | ORAL | Status: DC | PRN

## 2011-09-02 NOTE — Progress Notes (Signed)
Spoke with pt briefly. Diagnosed for several years and on med management up until pregnancy. Plans to go to Dr. Cunningham at [facility] for further med management if needed.  Patient was referred for history of depression/anxiety.  * Referral screened out by Clinical Social Worker because none of the following criteria appear to apply:  ~ History of anxiety/depression during this pregnancy, or of post-partum depression.  ~ Diagnosis of anxiety and/or depression within last 3 years  ~ History of depression due to pregnancy loss/loss of child  OR  * Patient's symptoms currently being treated with medication and/or therapy.  Please contact the Clinical Social Worker if needs arise, or by the patient's request.  

## 2011-09-02 NOTE — Progress Notes (Signed)
Post Partum Day 1:S/P SVB Subjective: Doing well.  Up ad lib without syncope or dizziness.  Had weak spell in L&D on 1st ambulation attempt, but that resolved quickly and has not recurred. Feeding:  Breast, and baby is doing well with this Contraceptive plan:   Undecided at present  Objective: Blood pressure 114/72, pulse 97, temperature 99.2 F (37.3 C), temperature source Oral, resp. rate 18, height 5\' 3"  (1.6 m), weight 156 lb (70.761 kg), last menstrual period 11/18/2010, SpO2 99.00%.  Physical Exam:  General: alert Lochia: appropriate Uterine Fundus: firm Incision: healing well DVT Evaluation: No evidence of DVT seen on physical exam. Negative Homan's sign.   Basename 09/02/11 0540 09/01/11 1300  HGB 10.8* 12.1  HCT 32.0* 36.3    Assessment/Plan: Day 0 pp Stable Continue current care.   LOS: 1 day   Nigel Bridgeman 09/02/2011, 8:37 AM

## 2011-09-02 NOTE — Progress Notes (Signed)
Repair in progress

## 2011-09-02 NOTE — Anesthesia Postprocedure Evaluation (Signed)
  Anesthesia Post-op Note  Patient: Elizabeth Garrison  Procedure(s) Performed: * No procedures listed *  Patient Location: PACU and Mother/Baby  Anesthesia Type: Epidural  Level of Consciousness: awake, alert  and oriented  Airway and Oxygen Therapy: Patient Spontanous Breathing  Post-op Pain: none  Post-op Assessment: Patient's Cardiovascular Status Stable, Respiratory Function Stable, Patent Airway, No signs of Nausea or vomiting, Adequate PO intake, Pain level controlled, No headache, No backache, No residual numbness and No residual motor weakness  Post-op Vital Signs: Reviewed and stable  Complications: No apparent anesthesia complications

## 2011-09-02 NOTE — Progress Notes (Signed)
Up to bathroom with steady. As entered bathroom with pt. On steady pt. Became pale and reports feeling dizzy. Pt. Head lowered to rail on staedy and returned to bed and head of bed lowered.

## 2011-09-02 NOTE — Progress Notes (Signed)
SVD of viable female 

## 2011-09-03 ENCOUNTER — Other Ambulatory Visit: Payer: BC Managed Care – PPO

## 2011-09-03 ENCOUNTER — Encounter: Payer: BC Managed Care – PPO | Admitting: Obstetrics and Gynecology

## 2011-09-03 MED ORDER — IBUPROFEN 600 MG PO TABS: 600.0000 mg | ORAL_TABLET | Freq: Four times a day (QID) | ORAL | Status: AC | PRN

## 2011-09-03 NOTE — Discharge Summary (Signed)
Physician Discharge Summary  Patient ID: Elizabeth Garrison MRN: [id number] DOB/AGE: 04-26-1985 26 y.o.  Admit date: 09/01/2011 Discharge date: 09/03/2011  Admission Diagnoses: admitted in active labor at 3/70%/-1, intact.  Discharge Diagnoses:  Principal Problem:  *NSVD (normal spontaneous vaginal delivery) Active Problems:  Active labor  H/O pyelonephritis as teen  Meconium stained amniotic fluid, delivered, current hospitalization   Discharged Condition: stable  Hospital Course: Patient has admitted in active labor 09/01/11. Proceeded to progress with epidural anesthesia. NSVD viable female infant "Richardson Dopp". Bilateral Labial lacerations, sutured with Monocryl. Mild postpartum anemia. Advised to take oral Iron Supplement. Lactating mother. Mother and baby discharge to home on D2 Postpartum with no complications. F/u 6 weeks at office or as need. Contraception: Barrier Method (Condoms).  Consults: None   Treatments: IV hydration and analgesia: ibuprofen  . Results for orders placed during the hospital encounter of 09/01/11 (from the past 48 hour(s))  CBC     Status: Normal   Collection Time   09/01/11  1:00 PM      Component Value Range Comment   WBC 10.5  4.0 - 10.5 K/uL    RBC 4.21  3.87 - 5.11 MIL/uL    Hemoglobin 12.1  12.0 - 15.0 g/dL    HCT 40.9  81.1 - 91.4 %    MCV 86.2  78.0 - 100.0 fL    MCH 28.7  26.0 - 34.0 pg    MCHC 33.3  30.0 - 36.0 g/dL    RDW 78.2  95.6 - 21.3 %    Platelets 202  150 - 400 K/uL   RPR     Status: Normal   Collection Time   09/01/11  1:00 PM      Component Value Range Comment   RPR NON REACTIVE  NON REACTIVE   CBC     Status: Abnormal   Collection Time   09/02/11  5:40 AM      Component Value Range Comment   WBC 16.2 (*) 4.0 - 10.5 K/uL    RBC 3.68 (*) 3.87 - 5.11 MIL/uL    Hemoglobin 10.8 (*) 12.0 - 15.0 g/dL    HCT 08.6 (*) 57.8 - 46.0 %    MCV 87.0  78.0 - 100.0 fL    MCH 29.3  26.0 - 34.0 pg    MCHC 33.8  30.0 - 36.0 g/dL    RDW  46.9  62.9 - 52.8 %    Platelets 183  150 - 400 K/uL     Discharge Exam: Blood pressure 101/68, pulse 81, temperature 98 F (36.7 C), temperature source Oral, resp. rate 18, height 5\' 3"  (1.6 m), weight 156 lb (70.761 kg), last menstrual period 11/18/2010, SpO2 99.00%, unknown if currently breastfeeding. General appearance: alert, cooperative and no distress. Affect: Upbeat. Lungs: Bilat clear CV: RRR Abdomen: Soft N/T all 4 quadrants GI: Normal GU: Slight delay in emptying bladder fully. Patient states that " she has to force last drops of urine out". Advised to monitor this situation and to call if symptoms do not subside. Perineum: Slight swelling and Labial healing well. Extremities: Upper and lower normal.  Disposition: 01-Home or Self Care   Medication List  As of 09/03/2011 11:09 AM   ASK your doctor about these medications         calcium carbonate 500 MG chewable tablet   Commonly known as: TUMS - dosed in mg elemental calcium   Chew 2 tablets by mouth at bedtime as needed. For heartburn  diphenhydrAMINE 25 MG tablet   Commonly known as: BENADRYL   Take 25 mg by mouth daily as needed. For sleep      prenatal multivitamin Tabs   Take 1 tablet by mouth daily.      TYLENOL PM EXTRA STRENGTH PO   Take by mouth at bedtime.           Follow-up Information    Follow up with [facility] in 6 weeks. (As needed)          Signed: Adrea Sherpa, CNM. 09/03/2011, 11:09 AM

## 2011-09-05 ENCOUNTER — Telehealth: Payer: Self-pay | Admitting: Obstetrics and Gynecology

## 2011-09-05 NOTE — Telephone Encounter (Signed)
Triage/general quest. 

## 2011-09-07 ENCOUNTER — Other Ambulatory Visit: Payer: Self-pay | Admitting: Obstetrics and Gynecology

## 2011-09-07 ENCOUNTER — Telehealth: Payer: Self-pay | Admitting: Obstetrics and Gynecology

## 2011-09-07 MED ORDER — SERTRALINE HCL 50 MG PO TABS: 50.0000 mg | ORAL_TABLET | Freq: Every day | ORAL | Status: DC

## 2011-09-07 NOTE — Telephone Encounter (Signed)
TC from pt. States has hx anxiety and OCD. Was prescribed Zoloft during pregnancy but opted not to take until PP. Requesting RX at this time. S/P delivery 09/01/11. Denies any sx of depression except occasionally cries but would like to treat anxiety sx that she has had "most of her life" Denies suicidal or homicidal thoughts. Per [facility], OK to take Zoloft while breast feeding. Rx ordered. Advised to take 25 mg x 7 days and then increase to 50 mg.  Pt states will make appt with  DR Tomasa Rand to F/U. PP appt scheduled. To call with any change in sx or any concerns  Pt verbalizes comprehension.

## 2011-09-07 NOTE — Telephone Encounter (Signed)
Triage/post partum depression

## 2011-09-09 ENCOUNTER — Inpatient Hospital Stay (HOSPITAL_COMMUNITY): Admission: RE | Admit: 2011-09-09 | Payer: BC Managed Care – PPO | Source: Ambulatory Visit

## 2011-10-16 ENCOUNTER — Telehealth: Payer: Self-pay | Admitting: Obstetrics and Gynecology

## 2011-10-16 NOTE — Telephone Encounter (Signed)
PC from pt c/o possible thrush.  Slight pain and not feeling her regular self.  Unknown temperature.  Suggested that patient go home and check temp.  Notify midwife on call if temp is over 100.4.  If worsens through the pm, notify midwife on call. Keep appointment tomorrow here in office. Pt agreeable.  ld

## 2011-10-17 ENCOUNTER — Ambulatory Visit (INDEPENDENT_AMBULATORY_CARE_PROVIDER_SITE_OTHER): Payer: BC Managed Care – PPO

## 2011-10-17 NOTE — Progress Notes (Signed)
Date of delivery: 10/02/11 Female Name: Elizabeth Garrison Vaginal delivery:yes Cesarean section:no Tubal ligation:no GDM:no Breast Feeding:yes Bottle Feeding:no Post-Partum Blues:yes Abnormal pap:no 02/09/11 WNL  Normal GU function: yes Normal GI function:yes Returning to work:yes EPDS: 8  PT C/O DIZZINESS PT C/O CONSTIPATION NO DESIRE IN bc AT THIS TIME

## 2012-08-27 NOTE — Progress Notes (Signed)
..   Subjective:     Elizabeth Garrison is a 26y.o. female who presents for a postpartum visit.   Pt has a h/o anxiety/OCD/depression; declined meds during pregnancy; started Zoloft 09/07/11 after calling to request Rx for s/s (25mg  initially x1 week, followed by 50mg ). Followed by Dr. Tomasa Rand at [facility].    Birthweight=8+9; last week, infant weighed 11+8. She is 6.5 weeks postpartum following a spontaneous vaginal delivery.  I have fully reviewed the prenatal and intrapartum course.  The delivery was at [redacted]w[redacted]d.  Outcome: spontaneous vaginal delivery.  Anesthesia: epidural. Postpartum course has been complicated by reocurrence of anxiety symptoms.  Baby's course has been uncomplicated.  Baby is feeding by breast.  Bleeding no bleeding.  Bowel function is constipation.  Bladder function is normal.  Patient is not sexually active.  Contraception method is none.  Postpartum depression screening: negative=8. C/o dizziness recently--Hgb WNL at time of d/c from SVD  The following portions of the patient's history were reviewed and updated as appropriate: allergies, current medications, past family history, past medical history, past social history, past surgical history and problem list.  Review of Systems Pertinent items are noted in HPI.   Objective:    BP 90/62  Resp 16  Wt 124 lb (56.246 kg)  BMI 21.97 kg/m2  Breastfeeding? Yes  General:  alert, cooperative, appears stated age and no distress   Breasts:  inspection negative, no nipple discharge or bleeding, no masses or nodularity palpable        Abdomen: soft, non-tender; bowel sounds normal; no masses,  no organomegaly   Vulva:  bilateral labial lacerations healed  Vagina: normal vagina  Cervix:  no cervical motion tenderness  Corpus: normal involution  Adnexa:  normal adnexa  Rectal Exam: Not performed.        Assessment:    Nml 6.5 week postpartum exam. Lactating Anxiety/OCD/depression stable on Zoloft  Pap  smear not done at today's visit.  Mild constipation Recent dizziness Plan:    1. Contraception: declines 2. Rec'd safe sexual practices/condoms w/ spermicide; rec'd daily vitamin, kegels, sunscreen, healthy diet & lifestyle 3. Follow up in:02/09/12 for Aex/Pap, or may defer until birth month in 6/'14, or f/u as needed. 4. May RTW/school/normal activities ad lib 5. Continue f/u w/ Dr. Tomasa Rand r/e mental health trx; support given; no HI or SI today. 6. Call prn if desires change in contraception 7. OTC measures rev'd for constipation (stool softner, probiotic); rec'd adequate water intake & 30g of fiber/day; slow position changes; f/u PCP r/e further dizziness if persists    Adam Demary H Late entry from 10/17/11

## 2015-07-01 DIAGNOSIS — M9902 Segmental and somatic dysfunction of thoracic region: Secondary | ICD-10-CM | POA: Diagnosis not present

## 2015-07-01 DIAGNOSIS — M9901 Segmental and somatic dysfunction of cervical region: Secondary | ICD-10-CM | POA: Diagnosis not present

## 2015-07-01 DIAGNOSIS — M62830 Muscle spasm of back: Secondary | ICD-10-CM | POA: Diagnosis not present

## 2015-07-01 DIAGNOSIS — M791 Myalgia: Secondary | ICD-10-CM | POA: Diagnosis not present

## 2015-10-11 DIAGNOSIS — Z01419 Encounter for gynecological examination (general) (routine) without abnormal findings: Secondary | ICD-10-CM | POA: Diagnosis not present

## 2015-10-11 DIAGNOSIS — Z3040 Encounter for surveillance of contraceptives, unspecified: Secondary | ICD-10-CM | POA: Diagnosis not present

## 2015-10-11 DIAGNOSIS — Z6823 Body mass index (BMI) 23.0-23.9, adult: Secondary | ICD-10-CM | POA: Diagnosis not present

## 2015-10-11 DIAGNOSIS — Z124 Encounter for screening for malignant neoplasm of cervix: Secondary | ICD-10-CM | POA: Diagnosis not present

## 2015-10-12 DIAGNOSIS — Z124 Encounter for screening for malignant neoplasm of cervix: Secondary | ICD-10-CM | POA: Diagnosis not present

## 2015-10-19 DIAGNOSIS — F411 Generalized anxiety disorder: Secondary | ICD-10-CM | POA: Diagnosis not present

## 2015-11-14 DIAGNOSIS — N200 Calculus of kidney: Secondary | ICD-10-CM | POA: Diagnosis not present

## 2015-11-18 DIAGNOSIS — M9901 Segmental and somatic dysfunction of cervical region: Secondary | ICD-10-CM | POA: Diagnosis not present

## 2015-11-18 DIAGNOSIS — M9903 Segmental and somatic dysfunction of lumbar region: Secondary | ICD-10-CM | POA: Diagnosis not present

## 2015-11-18 DIAGNOSIS — M9902 Segmental and somatic dysfunction of thoracic region: Secondary | ICD-10-CM | POA: Diagnosis not present

## 2015-12-19 DIAGNOSIS — H6982 Other specified disorders of Eustachian tube, left ear: Secondary | ICD-10-CM | POA: Diagnosis not present

## 2015-12-22 DIAGNOSIS — B029 Zoster without complications: Secondary | ICD-10-CM | POA: Diagnosis not present

## 2015-12-23 DIAGNOSIS — B009 Herpesviral infection, unspecified: Secondary | ICD-10-CM | POA: Diagnosis not present

## 2015-12-23 DIAGNOSIS — B029 Zoster without complications: Secondary | ICD-10-CM | POA: Diagnosis not present

## 2016-01-20 DIAGNOSIS — D225 Melanocytic nevi of trunk: Secondary | ICD-10-CM | POA: Diagnosis not present

## 2016-01-20 DIAGNOSIS — D1801 Hemangioma of skin and subcutaneous tissue: Secondary | ICD-10-CM | POA: Diagnosis not present

## 2016-01-20 DIAGNOSIS — L905 Scar conditions and fibrosis of skin: Secondary | ICD-10-CM | POA: Diagnosis not present

## 2016-02-20 DIAGNOSIS — Z3201 Encounter for pregnancy test, result positive: Secondary | ICD-10-CM | POA: Diagnosis not present

## 2016-02-20 DIAGNOSIS — N912 Amenorrhea, unspecified: Secondary | ICD-10-CM | POA: Diagnosis not present

## 2016-02-24 DIAGNOSIS — O3680X0 Pregnancy with inconclusive fetal viability, not applicable or unspecified: Secondary | ICD-10-CM | POA: Diagnosis not present

## 2016-03-01 ENCOUNTER — Encounter: Payer: Self-pay | Admitting: Obstetrics and Gynecology

## 2016-03-01 DIAGNOSIS — R109 Unspecified abdominal pain: Secondary | ICD-10-CM | POA: Diagnosis not present

## 2016-03-01 DIAGNOSIS — R197 Diarrhea, unspecified: Secondary | ICD-10-CM | POA: Diagnosis not present

## 2016-03-01 DIAGNOSIS — R110 Nausea: Secondary | ICD-10-CM | POA: Diagnosis not present

## 2016-03-01 DIAGNOSIS — Z331 Pregnant state, incidental: Secondary | ICD-10-CM | POA: Diagnosis not present

## 2016-03-05 ENCOUNTER — Other Ambulatory Visit: Payer: Self-pay | Admitting: Physician Assistant

## 2016-03-05 DIAGNOSIS — R1013 Epigastric pain: Secondary | ICD-10-CM | POA: Diagnosis not present

## 2016-03-05 DIAGNOSIS — R1011 Right upper quadrant pain: Secondary | ICD-10-CM

## 2016-03-06 DIAGNOSIS — R300 Dysuria: Secondary | ICD-10-CM | POA: Diagnosis not present

## 2016-03-06 DIAGNOSIS — Z6824 Body mass index (BMI) 24.0-24.9, adult: Secondary | ICD-10-CM | POA: Diagnosis not present

## 2016-03-09 ENCOUNTER — Other Ambulatory Visit: Payer: Self-pay

## 2016-03-13 DIAGNOSIS — Z3201 Encounter for pregnancy test, result positive: Secondary | ICD-10-CM | POA: Diagnosis not present

## 2016-03-16 ENCOUNTER — Ambulatory Visit
Admission: RE | Admit: 2016-03-16 | Discharge: 2016-03-16 | Disposition: A | Discharge status: Home / Self Care | Payer: BLUE CROSS/BLUE SHIELD | Source: Ambulatory Visit | Attending: Physician Assistant | Admitting: Physician Assistant

## 2016-03-16 DIAGNOSIS — R1011 Right upper quadrant pain: Secondary | ICD-10-CM

## 2016-03-30 DIAGNOSIS — Z3689 Encounter for other specified antenatal screening: Secondary | ICD-10-CM | POA: Diagnosis not present

## 2016-03-30 DIAGNOSIS — Z3491 Encounter for supervision of normal pregnancy, unspecified, first trimester: Secondary | ICD-10-CM | POA: Diagnosis not present

## 2016-03-30 DIAGNOSIS — Z36 Encounter for antenatal screening for chromosomal anomalies: Secondary | ICD-10-CM | POA: Diagnosis not present

## 2016-03-30 DIAGNOSIS — Z3481 Encounter for supervision of other normal pregnancy, first trimester: Secondary | ICD-10-CM | POA: Diagnosis not present

## 2016-03-30 LAB — OB RESULTS CONSOLE ABO/RH: RH Type: POSITIVE

## 2016-03-30 LAB — OB RESULTS CONSOLE GC/CHLAMYDIA
Chlamydia: NEGATIVE
Gonorrhea: NEGATIVE

## 2016-03-30 LAB — OB RESULTS CONSOLE RPR: RPR: NONREACTIVE

## 2016-03-30 LAB — OB RESULTS CONSOLE ANTIBODY SCREEN: Antibody Screen: NEGATIVE

## 2016-03-30 LAB — OB RESULTS CONSOLE HEPATITIS B SURFACE ANTIGEN: Hepatitis B Surface Ag: NEGATIVE

## 2016-03-30 LAB — OB RESULTS CONSOLE RUBELLA ANTIBODY, IGM: Rubella: IMMUNE

## 2016-03-30 LAB — OB RESULTS CONSOLE HIV ANTIBODY (ROUTINE TESTING): HIV: NONREACTIVE

## 2016-04-20 DIAGNOSIS — Z3682 Encounter for antenatal screening for nuchal translucency: Secondary | ICD-10-CM | POA: Diagnosis not present

## 2016-04-20 DIAGNOSIS — Z3481 Encounter for supervision of other normal pregnancy, first trimester: Secondary | ICD-10-CM | POA: Diagnosis not present

## 2016-05-11 DIAGNOSIS — Z3682 Encounter for antenatal screening for nuchal translucency: Secondary | ICD-10-CM | POA: Diagnosis not present

## 2016-05-11 DIAGNOSIS — Z361 Encounter for antenatal screening for raised alphafetoprotein level: Secondary | ICD-10-CM | POA: Diagnosis not present

## 2016-06-05 DIAGNOSIS — Z3A19 19 weeks gestation of pregnancy: Secondary | ICD-10-CM | POA: Diagnosis not present

## 2016-06-05 DIAGNOSIS — Z363 Encounter for antenatal screening for malformations: Secondary | ICD-10-CM | POA: Diagnosis not present

## 2016-06-05 DIAGNOSIS — O4442 Low lying placenta NOS or without hemorrhage, second trimester: Secondary | ICD-10-CM | POA: Diagnosis not present

## 2016-06-28 DIAGNOSIS — Z3A23 23 weeks gestation of pregnancy: Secondary | ICD-10-CM | POA: Diagnosis not present

## 2016-06-28 DIAGNOSIS — O4442 Low lying placenta NOS or without hemorrhage, second trimester: Secondary | ICD-10-CM | POA: Diagnosis not present

## 2016-07-28 DIAGNOSIS — J069 Acute upper respiratory infection, unspecified: Secondary | ICD-10-CM | POA: Diagnosis not present

## 2016-08-06 DIAGNOSIS — Z3A28 28 weeks gestation of pregnancy: Secondary | ICD-10-CM | POA: Diagnosis not present

## 2016-08-06 DIAGNOSIS — O4443 Low lying placenta NOS or without hemorrhage, third trimester: Secondary | ICD-10-CM | POA: Diagnosis not present

## 2016-08-06 DIAGNOSIS — Z3689 Encounter for other specified antenatal screening: Secondary | ICD-10-CM | POA: Diagnosis not present

## 2016-08-20 DIAGNOSIS — O4443 Low lying placenta NOS or without hemorrhage, third trimester: Secondary | ICD-10-CM | POA: Diagnosis not present

## 2016-08-20 DIAGNOSIS — Z23 Encounter for immunization: Secondary | ICD-10-CM | POA: Diagnosis not present

## 2016-08-20 DIAGNOSIS — N898 Other specified noninflammatory disorders of vagina: Secondary | ICD-10-CM | POA: Diagnosis not present

## 2016-08-20 DIAGNOSIS — Z3A30 30 weeks gestation of pregnancy: Secondary | ICD-10-CM | POA: Diagnosis not present

## 2016-08-22 DIAGNOSIS — M9901 Segmental and somatic dysfunction of cervical region: Secondary | ICD-10-CM | POA: Diagnosis not present

## 2016-08-22 DIAGNOSIS — M5442 Lumbago with sciatica, left side: Secondary | ICD-10-CM | POA: Diagnosis not present

## 2016-08-22 DIAGNOSIS — M9903 Segmental and somatic dysfunction of lumbar region: Secondary | ICD-10-CM | POA: Diagnosis not present

## 2016-08-22 DIAGNOSIS — M9905 Segmental and somatic dysfunction of pelvic region: Secondary | ICD-10-CM | POA: Diagnosis not present

## 2016-08-24 DIAGNOSIS — M9903 Segmental and somatic dysfunction of lumbar region: Secondary | ICD-10-CM | POA: Diagnosis not present

## 2016-08-24 DIAGNOSIS — M9905 Segmental and somatic dysfunction of pelvic region: Secondary | ICD-10-CM | POA: Diagnosis not present

## 2016-08-24 DIAGNOSIS — M9901 Segmental and somatic dysfunction of cervical region: Secondary | ICD-10-CM | POA: Diagnosis not present

## 2016-08-24 DIAGNOSIS — M5442 Lumbago with sciatica, left side: Secondary | ICD-10-CM | POA: Diagnosis not present

## 2016-09-03 DIAGNOSIS — Z3A32 32 weeks gestation of pregnancy: Secondary | ICD-10-CM | POA: Diagnosis not present

## 2016-09-03 DIAGNOSIS — O4443 Low lying placenta NOS or without hemorrhage, third trimester: Secondary | ICD-10-CM | POA: Diagnosis not present

## 2016-09-12 DIAGNOSIS — Z3483 Encounter for supervision of other normal pregnancy, third trimester: Secondary | ICD-10-CM | POA: Diagnosis not present

## 2016-09-12 DIAGNOSIS — Z3482 Encounter for supervision of other normal pregnancy, second trimester: Secondary | ICD-10-CM | POA: Diagnosis not present

## 2016-09-18 DIAGNOSIS — O4443 Low lying placenta NOS or without hemorrhage, third trimester: Secondary | ICD-10-CM | POA: Diagnosis not present

## 2016-09-18 DIAGNOSIS — Z3A34 34 weeks gestation of pregnancy: Secondary | ICD-10-CM | POA: Diagnosis not present

## 2016-09-20 ENCOUNTER — Other Ambulatory Visit: Payer: Self-pay | Admitting: Obstetrics & Gynecology

## 2016-09-24 DIAGNOSIS — M5442 Lumbago with sciatica, left side: Secondary | ICD-10-CM | POA: Diagnosis not present

## 2016-09-24 DIAGNOSIS — M9905 Segmental and somatic dysfunction of pelvic region: Secondary | ICD-10-CM | POA: Diagnosis not present

## 2016-09-24 DIAGNOSIS — M9901 Segmental and somatic dysfunction of cervical region: Secondary | ICD-10-CM | POA: Diagnosis not present

## 2016-09-24 DIAGNOSIS — M9903 Segmental and somatic dysfunction of lumbar region: Secondary | ICD-10-CM | POA: Diagnosis not present

## 2016-09-27 DIAGNOSIS — Z3A36 36 weeks gestation of pregnancy: Secondary | ICD-10-CM | POA: Diagnosis not present

## 2016-09-27 DIAGNOSIS — O09299 Supervision of pregnancy with other poor reproductive or obstetric history, unspecified trimester: Secondary | ICD-10-CM | POA: Diagnosis not present

## 2016-09-27 DIAGNOSIS — O4443 Low lying placenta NOS or without hemorrhage, third trimester: Secondary | ICD-10-CM | POA: Diagnosis not present

## 2016-09-27 DIAGNOSIS — Z3685 Encounter for antenatal screening for Streptococcus B: Secondary | ICD-10-CM | POA: Diagnosis not present

## 2016-09-27 LAB — OB RESULTS CONSOLE GBS: GBS: NEGATIVE

## 2016-10-02 DIAGNOSIS — Z3A36 36 weeks gestation of pregnancy: Secondary | ICD-10-CM | POA: Diagnosis not present

## 2016-10-02 DIAGNOSIS — O4443 Low lying placenta NOS or without hemorrhage, third trimester: Secondary | ICD-10-CM | POA: Diagnosis not present

## 2016-10-08 DIAGNOSIS — Z3A37 37 weeks gestation of pregnancy: Secondary | ICD-10-CM | POA: Diagnosis not present

## 2016-10-08 DIAGNOSIS — O4443 Low lying placenta NOS or without hemorrhage, third trimester: Secondary | ICD-10-CM | POA: Diagnosis not present

## 2016-10-08 DIAGNOSIS — Z23 Encounter for immunization: Secondary | ICD-10-CM | POA: Diagnosis not present

## 2016-10-09 ENCOUNTER — Encounter (HOSPITAL_COMMUNITY): Payer: Self-pay | Admitting: *Deleted

## 2016-10-12 ENCOUNTER — Encounter (HOSPITAL_COMMUNITY): Payer: Self-pay | Admitting: *Deleted

## 2016-10-12 ENCOUNTER — Inpatient Hospital Stay (HOSPITAL_COMMUNITY)
Admission: AD | Admit: 2016-10-12 | Discharge: 2016-10-12 | Disposition: A | Discharge status: Home / Self Care | Payer: BLUE CROSS/BLUE SHIELD | Source: Ambulatory Visit | Attending: Obstetrics | Admitting: Obstetrics

## 2016-10-12 DIAGNOSIS — O479 False labor, unspecified: Secondary | ICD-10-CM

## 2016-10-12 DIAGNOSIS — O133 Gestational [pregnancy-induced] hypertension without significant proteinuria, third trimester: Secondary | ICD-10-CM | POA: Insufficient documentation

## 2016-10-12 DIAGNOSIS — Z87442 Personal history of urinary calculi: Secondary | ICD-10-CM | POA: Diagnosis not present

## 2016-10-12 DIAGNOSIS — Z3A38 38 weeks gestation of pregnancy: Secondary | ICD-10-CM | POA: Diagnosis not present

## 2016-10-12 DIAGNOSIS — O4443 Low lying placenta NOS or without hemorrhage, third trimester: Secondary | ICD-10-CM | POA: Diagnosis not present

## 2016-10-12 LAB — CBC
HCT: 33 % — ABNORMAL LOW (ref 36.0–46.0)
Hemoglobin: 11.1 g/dL — ABNORMAL LOW (ref 12.0–15.0)
MCH: 29.4 pg (ref 26.0–34.0)
MCHC: 33.6 g/dL (ref 30.0–36.0)
MCV: 87.3 fL (ref 78.0–100.0)
Platelets: 177 10*3/uL (ref 150–400)
RBC: 3.78 MIL/uL — ABNORMAL LOW (ref 3.87–5.11)
RDW: 13.8 % (ref 11.5–15.5)
WBC: 8.9 10*3/uL (ref 4.0–10.5)

## 2016-10-12 LAB — COMPREHENSIVE METABOLIC PANEL
ALT: 14 U/L (ref 14–54)
AST: 21 U/L (ref 15–41)
Albumin: 2.9 g/dL — ABNORMAL LOW (ref 3.5–5.0)
Alkaline Phosphatase: 148 U/L — ABNORMAL HIGH (ref 38–126)
Anion gap: 7 (ref 5–15)
BUN: 12 mg/dL (ref 6–20)
CO2: 21 mmol/L — ABNORMAL LOW (ref 22–32)
Calcium: 8.4 mg/dL — ABNORMAL LOW (ref 8.9–10.3)
Chloride: 106 mmol/L (ref 101–111)
Creatinine, Ser: 0.46 mg/dL (ref 0.44–1.00)
GFR calc Af Amer: >60 mL/min (ref >60)
GFR calc non Af Amer: >60 mL/min (ref >60)
Glucose, Bld: 99 mg/dL (ref 65–99)
Potassium: 3.5 mmol/L (ref 3.5–5.1)
Sodium: 134 mmol/L — ABNORMAL LOW (ref 135–145)
Total Bilirubin: 0.7 mg/dL (ref 0.3–1.2)
Total Protein: 6.5 g/dL (ref 6.5–8.1)

## 2016-10-12 LAB — PROTEIN / CREATININE RATIO, URINE
Creatinine, Urine: 101 mg/dL
Protein Creatinine Ratio: 0.21 mg/mg{Cre} — ABNORMAL HIGH (ref 0.00–0.15)
Total Protein, Urine: 21 mg/dL

## 2016-10-12 NOTE — MAU Provider Note (Signed)
Chief Complaint:  Contractions   First Provider Initiated Contact with Patient 10/12/16 1158     HPI: Elizabeth Garrison is a 31 y.o. G3P1002 at 11w1dwho presents to maternity admissions reporting uterine contractions.  Was 3cm in office and sent here for monitoring and labor evaluation.   Scheduled for C/S next week due to hx of shoulder dystocia.. She reports good fetal movement, denies LOF, vaginal bleeding, vaginal itching/burning, urinary symptoms, h/a, dizziness, n/v, diarrhea, constipation or fever/chills.  She denies headache, visual changes or RUQ abdominal pain.  BP was noted to be elevated on assessment, so preeclampsia workup was started  Abdominal Pain  This is a new problem. The current episode started today. The onset quality is gradual. The problem occurs intermittently. The pain is located in the LLQ, RLQ and suprapubic region. The quality of the pain is cramping. Pertinent negatives include no constipation, diarrhea, dysuria, fever, headaches, myalgias, nausea or vomiting. Nothing aggravates the pain. The pain is relieved by nothing. She has tried nothing for the symptoms.    RN note: Was having cervical pain and contractions(not as bad as last night), went to office, was 3 cm. Sent over for further eval.  Is scheduled for a c/s on 10/11  Past Medical History: Past Medical History:  Diagnosis Date  . Anxiety    as a teen  . Asthma 02/08/11  . H/O cystitis   . H/O pyelonephritis    as adolescent  . H/O varicella   . History of kidney stones    age 63  . Yeast infection     Past obstetric history: OB History  Gravida Para Term Preterm AB Living  0 0 2  SAB TAB Ectopic Multiple Live Births  0 0 0 0 2    # Outcome Date GA Lbr Len/2nd Weight Sex Delivery Anes PTL Lv  3 Current           2 Term 09/01/11 [redacted]w[redacted]d 08:15 / 02:24 8 lb 8.9 oz (3.88 kg) M Vag-Spont EPI  LIV     Complications: Shoulder Dystocia  1 Para    8 lb 14 oz (4.026 kg) F Vag-Spont   LIV   Birth Comments: body dystocia      Past Surgical History: Past Surgical History:  Procedure Laterality Date  . TONSILLECTOMY     age 76  . WISDOM TOOTH EXTRACTION  age 34    Family History: Family History  Problem Relation Age of Onset  . Hypertension Mother   . Asthma Mother   . Thyroid disease Mother   . Other Mother        hypotension  . Hypertension Maternal Grandmother   . Cancer Maternal Grandfather        lymphoma  . Cancer Paternal Grandmother        liver  . Asthma Brother   . Hypertension Maternal Uncle   . Thyroid disease Maternal Uncle     Social History: Social History  Substance Use Topics  . Smoking status: Never Smoker  . Smokeless tobacco: Never Used  . Alcohol use No    Allergies: No Known Allergies  Meds:  Prescriptions Prior to Admission  Medication Sig Dispense Refill Last Dose  . acetaminophen (TYLENOL) 500 MG tablet Take 500 mg by mouth every 8 (eight) hours as needed for mild pain.     Marland Kitchen doxylamine, Sleep, (UNISOM) 25 MG tablet Take 25 mg by mouth at bedtime.      . Omeprazole (PRILOSEC  PO) Take 1 tablet by mouth daily.     . Prenatal Vit-Fe Fumarate-FA (PRENATAL MULTIVITAMIN) TABS Take 1 tablet by mouth 3 (three) times a week.    10/09/2016 at Unknown time  . sertraline (ZOLOFT) 100 MG tablet Take 100 mg by mouth at bedtime.        I have reviewed patient's Past Medical Hx, Surgical Hx, Family Hx, Social Hx, medications and allergies.   ROS:  Review of Systems  Constitutional: Negative for fever.  Gastrointestinal: Positive for abdominal pain. Negative for constipation, diarrhea, nausea and vomiting.  Genitourinary: Negative for dysuria.  Musculoskeletal: Negative for myalgias.  Neurological: Negative for headaches.   Other systems negative  Physical Exam  Patient Vitals for the past 24 hrs:  BP Temp Temp src Pulse Resp SpO2 Height Weight  10/12/16 1149 (!) 137/95 - - 98 - - - -  10/12/16 1122 133/86 98.4 F (36.9 C) Oral 98  16 100 %  (1.6 m) 164 lb 8 oz (74.6 kg)    137/91  Vitals:   10/12/16 1230 10/12/16 1245 10/12/16 1300 10/12/16 1315  BP: 133/82 131/86 124/79 123/87  Pulse: 95 (!) 105 96 92  Resp:      Temp:      TempSrc:      SpO2:      Weight:      Height:        Constitutional: Well-developed, well-nourished female in no acute distress.  Cardiovascular: normal rate and rhythm Respiratory: normal effort, clear to auscultation bilaterally GI: Abd soft, non-tender, gravid appropriate for gestational age.   No rebound or guarding. MS: Extremities nontender, Trace pedal edema, normal ROM Neurologic: Alert and oriented x 4. DTRs 2+, no clonus GU: Neg CVAT.  PELVIC EXAM: Dilation: 3 Cervical Position: Posterior Station: -3 Exam by:: Dorrene German RN   FHT:  Baseline 135 , moderate variability, accelerations present, no decelerations Contractions: q 10 mins Irregular    Labs: A/Positive/-- (03/23 0000) Results for orders placed or performed during the hospital encounter of 10/12/16 (from the past 24 hour(s))  CBC     Status: Abnormal   Collection Time: 10/12/16 11:56 AM  Result Value Ref Range   WBC 8.9 4.0 - 10.5 K/uL   RBC 3.78 (L) 3.87 - 5.11 MIL/uL   Hemoglobin 11.1 (L) 12.0 - 15.0 g/dL   HCT 41.3 (L) 24.4 - 01.0 %   MCV 87.3 78.0 - 100.0 fL   MCH 29.4 26.0 - 34.0 pg   MCHC 33.6 30.0 - 36.0 g/dL   RDW 27.2 53.6 - 64.4 %   Platelets 177 150 - 400 K/uL  Comprehensive metabolic panel     Status: Abnormal   Collection Time: 10/12/16 11:56 AM  Result Value Ref Range   Sodium 134 (L) 135 - 145 mmol/L   Potassium 3.5 3.5 - 5.1 mmol/L   Chloride 106 101 - 111 mmol/L   CO2 21 (L) 22 - 32 mmol/L   Glucose, Bld 99 65 - 99 mg/dL   BUN 12 6 - 20 mg/dL   Creatinine, Ser 0.34 0.44 - 1.00 mg/dL   Calcium 8.4 (L) 8.9 - 10.3 mg/dL   Total Protein 6.5 6.5 - 8.1 g/dL   Albumin 2.9 (L) 3.5 - 5.0 g/dL   AST 21 15 - 41 U/L   ALT 14 14 - 54 U/L   Alkaline Phosphatase 148 (H) 38 - 126 U/L    Total Bilirubin 0.7 0.3 - 1.2 mg/dL   GFR calc  non Af Amer >60 >60 mL/min   GFR calc Af Amer >60 >60 mL/min   Anion gap 7 5 - 15  Protein / creatinine ratio, urine     Status: Abnormal   Collection Time: 10/12/16 11:57 AM  Result Value Ref Range   Creatinine, Urine 101.00 mg/dL   Total Protein, Urine 21 mg/dL   Protein Creatinine Ratio 0.21 (H) 0.00 - 0.15 mg/mg[Cre]    Imaging:  No results found.  MAU Course/MDM: I have ordered labs and reviewed results. No evidence of preeclampsia. NST reviewed and found to be reassuring, Category I Consult Dr Amado Nash with presentation, exam findings and test results.   Assessment: Single IUP at 109w1d Irregular uterine contractions Gestational hypertension No evidence for preeclampsia  Plan: Discharge home Labor precautions and fetal kick counts Follow up in Office for prenatal visits and recheck status.  Encouraged to return here or to other Urgent Care/ED if she develops worsening of symptoms, increase in pain, fever, or other concerning symptoms.   Pt stable at time of discharge.  Wynelle Bourgeois CNM, MSN Certified Nurse-Midwife 10/12/2016 11:59 AM

## 2016-10-12 NOTE — Discharge Instructions (Signed)
Hypertension During Pregnancy Hypertension is also called high blood pressure. High blood pressure means that the force of your blood moving in your body is too strong. When you are pregnant, this condition should be watched carefully. It can cause problems for you and your baby. Follow these instructions at home: Eating and drinking  Drink enough fluid to keep your pee (urine) clear or pale yellow.  Eat healthy foods that are low in salt (sodium). ? Do not add salt to your food. ? Check labels on foods and drinks to see much salt is in them. Look on the label where you see "Sodium." Lifestyle  Do not use any products that contain nicotine or tobacco, such as cigarettes and e-cigarettes. If you need help quitting, ask your doctor.  Do not use alcohol.  Avoid caffeine.  Avoid stress. Rest and get plenty of sleep. General instructions  Take over-the-counter and prescription medicines only as told by your doctor.  While lying down, lie on your left side. This keeps pressure off your baby.  While sitting or lying down, raise (elevate) your feet. Try putting some pillows under your lower legs.  Exercise regularly. Ask your doctor what kinds of exercise are best for you.  Keep all prenatal and follow-up visits as told by your doctor. This is important. Contact a doctor if:  You have symptoms that your doctor told you to watch for, such as: ? Fever. ? Throwing up (vomiting). ? Headache. Get help right away if:  You have very bad pain in your belly (abdomen).  You are throwing up, and this does not get better with treatment.  You suddenly get swelling in your hands, ankles, or face.  You gain 4 lb (1.8 kg) or more in 1 week.  You get bleeding from your vagina.  You have blood in your pee.  You do not feel your baby moving as much as normal.  You have a change in vision.  You have muscle twitching or sudden tightening (spasms).  You have trouble breathing.  Your lips  or fingernails turn blue. This information is not intended to replace advice given to you by your health care provider. Make sure you discuss any questions you have with your health care provider. Document Released: 01/27/2010 Document Revised: 09/06/2015 Document Reviewed: 09/06/2015 Elsevier Interactive Patient Education  2017 [employer]  Vaginal Delivery Vaginal delivery means that you will give birth by pushing your baby out of your birth canal (vagina). A team of health care providers will help you before, during, and after vaginal delivery. Birth experiences are unique for every woman and every pregnancy, and birth experiences vary depending on where you choose to give birth. What should I do to prepare for my baby's birth? Before your baby is born, it is important to talk with your health care provider about:  Your labor and delivery preferences. These may include: ? Medicines that you may be given. ? How you will manage your pain. This might include non-medical pain relief techniques or injectable pain relief such as epidural analgesia. ? How you and your baby will be monitored during labor and delivery. ? Who may be in the labor and delivery room with you. ? Your feelings about surgical delivery of your baby (cesarean delivery, or C-section) if this becomes necessary. ? Your feelings about receiving donated blood through an IV tube (blood transfusion) if this becomes necessary.  Whether you are able: ? To take pictures or videos of the birth. ? To  eat during labor and delivery. ? To move around, walk, or change positions during labor and delivery.  What to expect after your baby is born, such as: ? Whether delayed umbilical cord clamping and cutting is offered. ? Who will care for your baby right after birth. ? Medicines or tests that may be recommended for your baby. ? Whether breastfeeding is supported in your hospital or birth center. ? How long you will be in the hospital  or birth center.  How any medical conditions you have may affect your baby or your labor and delivery experience.  To prepare for your baby's birth, you should also:  Attend all of your health care visits before delivery (prenatal visits) as recommended by your health care provider. This is important.  Prepare your home for your baby's arrival. Make sure that you have: ? Diapers. ? Baby clothing. ? Feeding equipment. ? Safe sleeping arrangements for you and your baby.  Install a car seat in your vehicle. Have your car seat checked by a certified car seat installer to make sure that it is installed safely.  Think about who will help you with your new baby at home for at least the first several weeks after delivery.  What can I expect when I arrive at the birth center or hospital? Once you are in labor and have been admitted into the hospital or birth center, your health care provider may:  Review your pregnancy history and any concerns you have.  Insert an IV tube into one of your veins. This is used to give you fluids and medicines.  Check your blood pressure, pulse, temperature, and heart rate (vital signs).  Check whether your bag of water (amniotic sac) has broken (ruptured).  Talk with you about your birth plan and discuss pain control options.  Monitoring Your health care provider may monitor your contractions (uterine monitoring) and your baby's heart rate (fetal monitoring). You may need to be monitored:  Often, but not continuously (intermittently).  All the time or for long periods at a time (continuously). Continuous monitoring may be needed if: ? You are taking certain medicines, such as medicine to relieve pain or make your contractions stronger. ? You have pregnancy or labor complications.  Monitoring may be done by:  Placing a special stethoscope or a handheld monitoring device on your abdomen to check your baby's heartbeat, and feeling your abdomen for  contractions. This method of monitoring does not continuously record your baby's heartbeat or your contractions.  Placing monitors on your abdomen (external monitors) to record your baby's heartbeat and the frequency and length of contractions. You may not have to wear external monitors all the time.  Placing monitors inside of your uterus (internal monitors) to record your baby's heartbeat and the frequency, length, and strength of your contractions. ? Your health care provider may use internal monitors if he or she needs more information about the strength of your contractions or your baby's heart rate. ? Internal monitors are put in place by passing a thin, flexible wire through your vagina and into your uterus. Depending on the type of monitor, it may remain in your uterus or on your baby's head until birth. ? Your health care provider will discuss the benefits and risks of internal monitoring with you and will ask for your permission before inserting the monitors.  Telemetry. This is a type of continuous monitoring that can be done with external or internal monitors. Instead of having to stay in bed,  you are able to move around during telemetry. Ask your health care provider if telemetry is an option for you.  Physical exam Your health care provider may perform a physical exam. This may include:  Checking whether your baby is positioned: ? With the head toward your vagina (head-down). This is most common. ? With the head toward the top of your uterus (head-up or breech). If your baby is in a breech position, your health care provider may try to turn your baby to a head-down position so you can deliver vaginally. If it does not seem that your baby can be born vaginally, your provider may recommend surgery to deliver your baby. In rare cases, you may be able to deliver vaginally if your baby is head-up (breech delivery). ? Lying sideways (transverse). Babies that are lying sideways cannot be  delivered vaginally.  Checking your cervix to determine: ? Whether it is thinning out (effacing). ? Whether it is opening up (dilating). ? How low your baby has moved into your birth canal.  What are the three stages of labor and delivery?  Normal labor and delivery is divided into the following three stages: Stage 1  Stage 1 is the longest stage of labor, and it can last for hours or days. Stage 1 includes: ? Early labor. This is when contractions may be irregular, or regular and mild. Generally, early labor contractions are more than 10 minutes apart. ? Active labor. This is when contractions get longer, more regular, more frequent, and more intense. ? The transition phase. This is when contractions happen very close together, are very intense, and may last longer than during any other part of labor.  Contractions generally feel mild, infrequent, and irregular at first. They get stronger, more frequent (about every 2-3 minutes), and more regular as you progress from early labor through active labor and transition.  Many women progress through stage 1 naturally, but you may need help to continue making progress. If this happens, your health care provider may talk with you about: ? Rupturing your amniotic sac if it has not ruptured yet. ? Giving you medicine to help make your contractions stronger and more frequent.  Stage 1 ends when your cervix is completely dilated to 4 inches (10 cm) and completely effaced. This happens at the end of the transition phase. Stage 2  Once your cervix is completely effaced and dilated to 4 inches (10 cm), you may start to feel an urge to push. It is common for the body to naturally take a rest before feeling the urge to push, especially if you received an epidural or certain other pain medicines. This rest period may last for up to 1-2 hours, depending on your unique labor experience.  During stage 2, contractions are generally less painful, because  pushing helps relieve contraction pain. Instead of contraction pain, you may feel stretching and burning pain, especially when the widest part of your baby's head passes through the vaginal opening (crowning).  Your health care provider will closely monitor your pushing progress and your baby's progress through the vagina during stage 2.  Your health care provider may massage the area of skin between your vaginal opening and anus (perineum) or apply warm compresses to your perineum. This helps it stretch as the baby's head starts to crown, which can help prevent perineal tearing. ? In some cases, an incision may be made in your perineum (episiotomy) to allow the baby to pass through the vaginal opening. An episiotomy helps  to make the opening of the vagina larger to allow more room for the baby to fit through.  It is very important to breathe and focus so your health care provider can control the delivery of your baby's head. Your health care provider may have you decrease the intensity of your pushing, to help prevent perineal tearing.  After delivery of your baby's head, the shoulders and the rest of the body generally deliver very quickly and without difficulty.  Once your baby is delivered, the umbilical cord may be cut right away, or this may be delayed for 1-2 minutes, depending on your baby's health. This may vary among health care providers, hospitals, and birth centers.  If you and your baby are healthy enough, your baby may be placed on your chest or abdomen to help maintain the baby's temperature and to help you bond with each other. Some mothers and babies start breastfeeding at this time. Your health care team will dry your baby and help keep your baby warm during this time.  Your baby may need immediate care if he or she: ? Showed signs of distress during labor. ? Has a medical condition. ? Was born too early (prematurely). ? Had a bowel movement before birth (meconium). ? Shows  signs of difficulty transitioning from being inside the uterus to being outside of the uterus. If you are planning to breastfeed, your health care team will help you begin a feeding. Stage 3  The third stage of labor starts immediately after the birth of your baby and ends after you deliver the placenta. The placenta is an organ that develops during pregnancy to provide oxygen and nutrients to your baby in the womb.  Delivering the placenta may require some pushing, and you may have mild contractions. Breastfeeding can stimulate contractions to help you deliver the placenta.  After the placenta is delivered, your uterus should tighten (contract) and become firm. This helps to stop bleeding in your uterus. To help your uterus contract and to control bleeding, your health care provider may: ? Give you medicine by injection, through an IV tube, by mouth, or through your rectum (rectally). ? Massage your abdomen or perform a vaginal exam to remove any blood clots that are left in your uterus. ? Empty your bladder by placing a thin, flexible tube (catheter) into your bladder. ? Encourage you to breastfeed your baby. After labor is over, you and your baby will be monitored closely to ensure that you are both healthy until you are ready to go home. Your health care team will teach you how to care for yourself and your baby. This information is not intended to replace advice given to you by your health care provider. Make sure you discuss any questions you have with your health care provider. Document Released: 10/04/2007 Document Revised: 07/15/2015 Document Reviewed: 01/09/2015 Elsevier Interactive Patient Education  2018 [employer].

## 2016-10-12 NOTE — MAU Note (Signed)
Was having cervical pain and contractions(not as bad as last night), went to office, was 3 cm. Sent over for further eval.  Is scheduled for a c/s on 10/11

## 2016-10-16 DIAGNOSIS — O4443 Low lying placenta NOS or without hemorrhage, third trimester: Secondary | ICD-10-CM | POA: Diagnosis not present

## 2016-10-16 DIAGNOSIS — Z3A38 38 weeks gestation of pregnancy: Secondary | ICD-10-CM | POA: Diagnosis not present

## 2016-10-16 NOTE — Patient Instructions (Signed)
Elizabeth Garrison  10/16/2016   Your procedure is scheduled on:  10/18/2016  Enter through the Main Entrance of [facility] at 0530 AM.  Pick up the phone at the desk and dial [phone]  Call this number if you have problems the morning of surgery:[phone]  Remember:   Do not eat food:After Midnight.  Do not drink clear liquids: After Midnight.  Take these medicines the morning of surgery with A SIP OF WATER: may take zoloft and prilosec   Do not wear jewelry, make-up or nail polish.  Do not wear lotions, powders, or perfumes. Do not wear deodorant.  Do not shave 48 hours prior to surgery.  Do not bring valuables to the hospital.  [facility] is not   responsible for any belongings or valuables brought to the hospital.  Contacts, dentures or bridgework may not be worn into surgery.  Leave suitcase in the car. After surgery it may be brought to your room.  For patients admitted to the hospital, checkout time is 11:00 AM the day of              discharge.    N/A   Please read over the following fact sheets that you were given:   Surgical Site Infection Prevention

## 2016-10-17 ENCOUNTER — Encounter (HOSPITAL_COMMUNITY)
Admission: RE | Admit: 2016-10-17 | Discharge: 2016-10-17 | Disposition: A | Discharge status: Home / Self Care | Payer: BLUE CROSS/BLUE SHIELD | Source: Ambulatory Visit | Attending: Obstetrics & Gynecology | Admitting: Obstetrics & Gynecology

## 2016-10-17 DIAGNOSIS — O34211 Maternal care for low transverse scar from previous cesarean delivery: Secondary | ICD-10-CM | POA: Diagnosis not present

## 2016-10-17 DIAGNOSIS — Z8759 Personal history of other complications of pregnancy, childbirth and the puerperium: Secondary | ICD-10-CM | POA: Diagnosis not present

## 2016-10-17 DIAGNOSIS — O3663X0 Maternal care for excessive fetal growth, third trimester, not applicable or unspecified: Secondary | ICD-10-CM | POA: Diagnosis not present

## 2016-10-17 DIAGNOSIS — O09293 Supervision of pregnancy with other poor reproductive or obstetric history, third trimester: Secondary | ICD-10-CM | POA: Diagnosis not present

## 2016-10-17 DIAGNOSIS — Z23 Encounter for immunization: Secondary | ICD-10-CM | POA: Diagnosis not present

## 2016-10-17 DIAGNOSIS — F329 Major depressive disorder, single episode, unspecified: Secondary | ICD-10-CM | POA: Diagnosis not present

## 2016-10-17 DIAGNOSIS — O99344 Other mental disorders complicating childbirth: Secondary | ICD-10-CM | POA: Diagnosis not present

## 2016-10-17 DIAGNOSIS — F419 Anxiety disorder, unspecified: Secondary | ICD-10-CM | POA: Diagnosis not present

## 2016-10-17 DIAGNOSIS — Z412 Encounter for routine and ritual male circumcision: Secondary | ICD-10-CM | POA: Diagnosis not present

## 2016-10-17 DIAGNOSIS — O9902 Anemia complicating childbirth: Secondary | ICD-10-CM | POA: Diagnosis not present

## 2016-10-17 DIAGNOSIS — D649 Anemia, unspecified: Secondary | ICD-10-CM | POA: Diagnosis not present

## 2016-10-17 DIAGNOSIS — Z3A39 39 weeks gestation of pregnancy: Secondary | ICD-10-CM | POA: Diagnosis not present

## 2016-10-17 LAB — CBC
HCT: 31.6 % — ABNORMAL LOW (ref 36.0–46.0)
Hemoglobin: 10.7 g/dL — ABNORMAL LOW (ref 12.0–15.0)
MCH: 29.2 pg (ref 26.0–34.0)
MCHC: 33.9 g/dL (ref 30.0–36.0)
MCV: 86.1 fL (ref 78.0–100.0)
Platelets: 158 10*3/uL (ref 150–400)
RBC: 3.67 MIL/uL — ABNORMAL LOW (ref 3.87–5.11)
RDW: 13.6 % (ref 11.5–15.5)
WBC: 7.2 10*3/uL (ref 4.0–10.5)

## 2016-10-17 LAB — TYPE AND SCREEN
ABO/RH(D): A POS
Antibody Screen: NEGATIVE

## 2016-10-17 LAB — ABO/RH: ABO/RH(D): A POS

## 2016-10-17 NOTE — Anesthesia Preprocedure Evaluation (Signed)
Anesthesia Evaluation  Patient identified by MRN, date of birth, ID band Patient awake    Reviewed: Allergy & Precautions, H&P , Patient's Chart, lab work & pertinent test results, reviewed documented beta blocker date and time   Airway Mallampati: II  TM Distance: >3 FB Neck ROM: full    Dental no notable dental hx.    Pulmonary asthma ,    Pulmonary exam normal breath sounds clear to auscultation       Cardiovascular  Rhythm:regular Rate:Normal     Neuro/Psych    GI/Hepatic   Endo/Other    Renal/GU      Musculoskeletal   Abdominal   Peds  Hematology   Anesthesia Other Findings   Reproductive/Obstetrics                             Anesthesia Physical Anesthesia Plan  ASA: II  Anesthesia Plan: Spinal   Post-op Pain Management:    Induction:   PONV Risk Score and Plan:   Airway Management Planned:   Additional Equipment:   Intra-op Plan:   Post-operative Plan:   Informed Consent: I have reviewed the patients History and Physical, chart, labs and discussed the procedure including the risks, benefits and alternatives for the proposed anesthesia with the patient or authorized representative who has indicated his/her understanding and acceptance.   Dental Advisory Given  Plan Discussed with: CRNA and Surgeon  Anesthesia Plan Comments: (  )        Anesthesia Quick Evaluation

## 2016-10-18 ENCOUNTER — Encounter (HOSPITAL_COMMUNITY)
Admission: RE | Disposition: A | Discharge status: Home / Self Care | Payer: Self-pay | Source: Ambulatory Visit | Attending: Obstetrics & Gynecology

## 2016-10-18 ENCOUNTER — Inpatient Hospital Stay (HOSPITAL_COMMUNITY): Payer: BLUE CROSS/BLUE SHIELD

## 2016-10-18 ENCOUNTER — Inpatient Hospital Stay (HOSPITAL_COMMUNITY)
Admission: RE | Admit: 2016-10-18 | Discharge: 2016-10-21 | DRG: 788 | Disposition: A | Discharge status: Home / Self Care | Payer: BLUE CROSS/BLUE SHIELD | Source: Ambulatory Visit | Attending: Obstetrics & Gynecology | Admitting: Obstetrics & Gynecology

## 2016-10-18 ENCOUNTER — Encounter (HOSPITAL_COMMUNITY): Payer: Self-pay

## 2016-10-18 DIAGNOSIS — F329 Major depressive disorder, single episode, unspecified: Secondary | ICD-10-CM | POA: Diagnosis present

## 2016-10-18 DIAGNOSIS — Z412 Encounter for routine and ritual male circumcision: Secondary | ICD-10-CM | POA: Diagnosis not present

## 2016-10-18 DIAGNOSIS — O99344 Other mental disorders complicating childbirth: Secondary | ICD-10-CM | POA: Diagnosis present

## 2016-10-18 DIAGNOSIS — F419 Anxiety disorder, unspecified: Secondary | ICD-10-CM | POA: Diagnosis present

## 2016-10-18 DIAGNOSIS — D649 Anemia, unspecified: Secondary | ICD-10-CM | POA: Diagnosis present

## 2016-10-18 DIAGNOSIS — Z8759 Personal history of other complications of pregnancy, childbirth and the puerperium: Secondary | ICD-10-CM | POA: Diagnosis not present

## 2016-10-18 DIAGNOSIS — Z3A39 39 weeks gestation of pregnancy: Secondary | ICD-10-CM

## 2016-10-18 DIAGNOSIS — O3663X0 Maternal care for excessive fetal growth, third trimester, not applicable or unspecified: Secondary | ICD-10-CM | POA: Diagnosis present

## 2016-10-18 DIAGNOSIS — O34211 Maternal care for low transverse scar from previous cesarean delivery: Secondary | ICD-10-CM | POA: Diagnosis not present

## 2016-10-18 DIAGNOSIS — O09299 Supervision of pregnancy with other poor reproductive or obstetric history, unspecified trimester: Secondary | ICD-10-CM

## 2016-10-18 DIAGNOSIS — Z862 Personal history of diseases of the blood and blood-forming organs and certain disorders involving the immune mechanism: Secondary | ICD-10-CM

## 2016-10-18 DIAGNOSIS — O09293 Supervision of pregnancy with other poor reproductive or obstetric history, third trimester: Secondary | ICD-10-CM | POA: Diagnosis not present

## 2016-10-18 DIAGNOSIS — O9902 Anemia complicating childbirth: Secondary | ICD-10-CM | POA: Diagnosis present

## 2016-10-18 LAB — RPR: RPR Ser Ql: NONREACTIVE

## 2016-10-18 SURGERY — Surgical Case
Anesthesia: Spinal

## 2016-10-18 MED ORDER — COCONUT OIL OIL: 1.0000 "application " | TOPICAL_OIL | Status: DC | PRN

## 2016-10-18 MED ORDER — BUPIVACAINE IN DEXTROSE 0.75-8.25 % IT SOLN
INTRATHECAL | Status: AC
  Filled 2016-10-18: qty 2

## 2016-10-18 MED ORDER — OXYTOCIN 10 UNIT/ML IJ SOLN
INTRAMUSCULAR | Status: AC
  Filled 2016-10-18: qty 4

## 2016-10-18 MED ORDER — PRENATAL MULTIVITAMIN CH
1.0000 | ORAL_TABLET | Freq: Every day | ORAL | Status: DC
  Administered 2016-10-19 – 2016-10-20 (×2): 1 via ORAL
  Filled 2016-10-18 (×2): qty 1

## 2016-10-18 MED ORDER — MORPHINE SULFATE (PF) 0.5 MG/ML IJ SOLN
INTRAMUSCULAR | Status: AC
  Filled 2016-10-18: qty 10

## 2016-10-18 MED ORDER — LACTATED RINGERS IV SOLN
INTRAVENOUS | Status: DC | PRN
  Administered 2016-10-18: 08:00:00 via INTRAVENOUS

## 2016-10-18 MED ORDER — SIMETHICONE 80 MG PO CHEW
80.0000 mg | CHEWABLE_TABLET | ORAL | Status: DC
  Administered 2016-10-19 – 2016-10-20 (×2): 80 mg via ORAL
  Filled 2016-10-18 (×3): qty 1

## 2016-10-18 MED ORDER — CEFAZOLIN SODIUM-DEXTROSE 2-4 GM/100ML-% IV SOLN
2.0000 g | INTRAVENOUS | Status: AC
  Administered 2016-10-18: 2 g via INTRAVENOUS

## 2016-10-18 MED ORDER — ONDANSETRON HCL 4 MG/2ML IJ SOLN
INTRAMUSCULAR | Status: AC
  Filled 2016-10-18: qty 2

## 2016-10-18 MED ORDER — HYDROMORPHONE HCL 1 MG/ML IJ SOLN: 0.2500 mg | INTRAMUSCULAR | Status: DC | PRN

## 2016-10-18 MED ORDER — METHYLERGONOVINE MALEATE 0.2 MG/ML IJ SOLN: 0.2000 mg | Freq: Three times a day (TID) | INTRAMUSCULAR | Status: AC

## 2016-10-18 MED ORDER — ONDANSETRON HCL 4 MG/2ML IJ SOLN
INTRAMUSCULAR | Status: DC | PRN
  Administered 2016-10-18: 4 mg via INTRAVENOUS

## 2016-10-18 MED ORDER — DIPHENHYDRAMINE HCL 50 MG/ML IJ SOLN
INTRAMUSCULAR | Status: DC | PRN
  Administered 2016-10-18: 12.5 mg via INTRAVENOUS

## 2016-10-18 MED ORDER — METHYLERGONOVINE MALEATE 0.2 MG/ML IJ SOLN
INTRAMUSCULAR | Status: DC | PRN
  Administered 2016-10-18: 0.2 mg via INTRAMUSCULAR

## 2016-10-18 MED ORDER — DIBUCAINE 1 % RE OINT: 1.0000 "application " | TOPICAL_OINTMENT | RECTAL | Status: DC | PRN

## 2016-10-18 MED ORDER — OXYTOCIN 40 UNITS IN LACTATED RINGERS INFUSION - SIMPLE MED: 2.5000 [IU]/h | INTRAVENOUS | Status: AC

## 2016-10-18 MED ORDER — DIPHENHYDRAMINE HCL 50 MG/ML IJ SOLN
INTRAMUSCULAR | Status: AC
  Filled 2016-10-18: qty 1

## 2016-10-18 MED ORDER — MENTHOL 3 MG MT LOZG
1.0000 | LOZENGE | OROMUCOSAL | Status: DC | PRN
Start: 2016-10-18 — End: 2016-10-21

## 2016-10-18 MED ORDER — SENNOSIDES-DOCUSATE SODIUM 8.6-50 MG PO TABS
2.0000 | ORAL_TABLET | ORAL | Status: DC
  Administered 2016-10-18 – 2016-10-20 (×3): 2 via ORAL
  Filled 2016-10-18 (×3): qty 2

## 2016-10-18 MED ORDER — SERTRALINE HCL 100 MG PO TABS
100.0000 mg | ORAL_TABLET | Freq: Every day | ORAL | Status: DC
  Administered 2016-10-18: 100 mg via ORAL
  Filled 2016-10-18 (×3): qty 1

## 2016-10-18 MED ORDER — DIPHENHYDRAMINE HCL 25 MG PO CAPS
25.0000 mg | ORAL_CAPSULE | Freq: Four times a day (QID) | ORAL | Status: DC | PRN
  Administered 2016-10-18: 25 mg via ORAL
  Filled 2016-10-18: qty 1

## 2016-10-18 MED ORDER — ACETAMINOPHEN 325 MG PO TABS
650.0000 mg | ORAL_TABLET | ORAL | Status: DC | PRN
  Administered 2016-10-18 – 2016-10-20 (×9): 650 mg via ORAL
  Filled 2016-10-18 (×8): qty 2

## 2016-10-18 MED ORDER — SCOPOLAMINE 1 MG/3DAYS TD PT72
MEDICATED_PATCH | TRANSDERMAL | Status: DC | PRN
  Administered 2016-10-18: 1 via TRANSDERMAL

## 2016-10-18 MED ORDER — METHYLERGONOVINE MALEATE 0.2 MG PO TABS
0.2000 mg | ORAL_TABLET | Freq: Three times a day (TID) | ORAL | Status: AC
  Administered 2016-10-18 – 2016-10-19 (×3): 0.2 mg via ORAL
  Filled 2016-10-18 (×3): qty 1

## 2016-10-18 MED ORDER — PANTOPRAZOLE SODIUM 40 MG PO TBEC
40.0000 mg | DELAYED_RELEASE_TABLET | Freq: Every day | ORAL | Status: DC
  Filled 2016-10-18 (×2): qty 1

## 2016-10-18 MED ORDER — ACETAMINOPHEN 500 MG PO TABS
1000.0000 mg | ORAL_TABLET | Freq: Once | ORAL | Status: AC
  Administered 2016-10-18: 1000 mg via ORAL

## 2016-10-18 MED ORDER — WITCH HAZEL-GLYCERIN EX PADS: 1.0000 "application " | MEDICATED_PAD | CUTANEOUS | Status: DC | PRN

## 2016-10-18 MED ORDER — OXYTOCIN 10 UNIT/ML IJ SOLN
INTRAMUSCULAR | Status: DC | PRN
  Administered 2016-10-18: 40 [IU] via INTRAVENOUS

## 2016-10-18 MED ORDER — MORPHINE SULFATE (PF) 0.5 MG/ML IJ SOLN
INTRAMUSCULAR | Status: DC | PRN
  Administered 2016-10-18: .1 mg via INTRATHECAL

## 2016-10-18 MED ORDER — TETANUS-DIPHTH-ACELL PERTUSSIS 5-2.5-18.5 LF-MCG/0.5 IM SUSP: 0.5000 mL | Freq: Once | INTRAMUSCULAR | Status: DC

## 2016-10-18 MED ORDER — FENTANYL CITRATE (PF) 100 MCG/2ML IJ SOLN
INTRAMUSCULAR | Status: AC
  Filled 2016-10-18: qty 2

## 2016-10-18 MED ORDER — OXYCODONE HCL 5 MG PO TABS
5.0000 mg | ORAL_TABLET | ORAL | Status: DC | PRN
  Administered 2016-10-18 – 2016-10-19 (×4): 5 mg via ORAL
  Filled 2016-10-18 (×4): qty 1

## 2016-10-18 MED ORDER — BUPIVACAINE IN DEXTROSE 0.75-8.25 % IT SOLN
INTRATHECAL | Status: DC | PRN
  Administered 2016-10-18: 1.3 mL via INTRATHECAL

## 2016-10-18 MED ORDER — LACTATED RINGERS IV SOLN
INTRAVENOUS | Status: DC
  Administered 2016-10-18 (×2): via INTRAVENOUS

## 2016-10-18 MED ORDER — METHYLERGONOVINE MALEATE 0.2 MG/ML IJ SOLN
INTRAMUSCULAR | Status: AC
  Filled 2016-10-18: qty 1

## 2016-10-18 MED ORDER — DIPHENHYDRAMINE HCL 50 MG/ML IJ SOLN
12.5000 mg | Freq: Once | INTRAMUSCULAR | Status: AC
  Administered 2016-10-18: 12.5 mg via INTRAVENOUS

## 2016-10-18 MED ORDER — PHENYLEPHRINE 8 MG IN D5W 100 ML (0.08MG/ML) PREMIX OPTIME
INJECTION | INTRAVENOUS | Status: AC
  Filled 2016-10-18: qty 100

## 2016-10-18 MED ORDER — ACETAMINOPHEN 500 MG PO TABS
ORAL_TABLET | ORAL | Status: AC
  Administered 2016-10-18: 650 mg via ORAL
  Filled 2016-10-18: qty 2

## 2016-10-18 MED ORDER — SIMETHICONE 80 MG PO CHEW
80.0000 mg | CHEWABLE_TABLET | Freq: Three times a day (TID) | ORAL | Status: DC
  Administered 2016-10-18 – 2016-10-21 (×9): 80 mg via ORAL
  Filled 2016-10-18 (×8): qty 1

## 2016-10-18 MED ORDER — FENTANYL CITRATE (PF) 100 MCG/2ML IJ SOLN
INTRAMUSCULAR | Status: DC | PRN
  Administered 2016-10-18: 25 ug via INTRATHECAL

## 2016-10-18 MED ORDER — PHENYLEPHRINE 8 MG IN D5W 100 ML (0.08MG/ML) PREMIX OPTIME
INJECTION | INTRAVENOUS | Status: DC | PRN
  Administered 2016-10-18: 60 ug/min via INTRAVENOUS

## 2016-10-18 MED ORDER — SCOPOLAMINE 1 MG/3DAYS TD PT72
MEDICATED_PATCH | TRANSDERMAL | Status: AC
  Filled 2016-10-18: qty 1

## 2016-10-18 MED ORDER — IBUPROFEN 600 MG PO TABS
600.0000 mg | ORAL_TABLET | Freq: Four times a day (QID) | ORAL | Status: DC
  Administered 2016-10-18 – 2016-10-21 (×11): 600 mg via ORAL
  Filled 2016-10-18 (×10): qty 1

## 2016-10-18 MED ORDER — SIMETHICONE 80 MG PO CHEW: 80.0000 mg | CHEWABLE_TABLET | ORAL | Status: DC | PRN

## 2016-10-18 MED ORDER — LACTATED RINGERS IV SOLN
INTRAVENOUS | Status: DC
  Administered 2016-10-18: 16:00:00 via INTRAVENOUS

## 2016-10-18 SURGICAL SUPPLY — 37 items
APL SKNCLS STERI-STRIP NONHPOA (GAUZE/BANDAGES/DRESSINGS) ×1
BENZOIN TINCTURE PRP APPL 2/3 (GAUZE/BANDAGES/DRESSINGS) ×1 IMPLANT
CHLORAPREP W/TINT 26ML (MISCELLANEOUS) ×2 IMPLANT
CLAMP CORD UMBIL (MISCELLANEOUS) IMPLANT
CLOSURE STERI STRIP 1/2 X4 (GAUZE/BANDAGES/DRESSINGS) ×1 IMPLANT
CLOTH BEACON ORANGE TIMEOUT ST (SAFETY) ×2 IMPLANT
CONTAINER PREFILL 10% NBF 15ML (MISCELLANEOUS) IMPLANT
DRAPE C SECTION CLR SCREEN (DRAPES) ×2 IMPLANT
DRSG OPSITE POSTOP 4X10 (GAUZE/BANDAGES/DRESSINGS) ×2 IMPLANT
ELECT REM PT RETURN 9FT ADLT (ELECTROSURGICAL) ×2
ELECTRODE REM PT RTRN 9FT ADLT (ELECTROSURGICAL) ×1 IMPLANT
EXTRACTOR VACUUM KIWI (MISCELLANEOUS) IMPLANT
EXTRACTOR VACUUM M CUP 4 TUBE (SUCTIONS) IMPLANT
GLOVE BIO SURGEON STRL SZ7 (GLOVE) ×2 IMPLANT
GLOVE BIOGEL PI IND STRL 7.0 (GLOVE) ×2 IMPLANT
GLOVE BIOGEL PI INDICATOR 7.0 (GLOVE) ×2
GOWN STRL REUS W/TWL LRG LVL3 (GOWN DISPOSABLE) ×4 IMPLANT
KIT ABG SYR 3ML LUER SLIP (SYRINGE) IMPLANT
NEEDLE HYPO 25X5/8 SAFETYGLIDE (NEEDLE) IMPLANT
NS IRRIG 1000ML POUR BTL (IV SOLUTION) ×2 IMPLANT
PACK C SECTION WH (CUSTOM PROCEDURE TRAY) ×2 IMPLANT
PAD ABD 8X7 1/2 STERILE (GAUZE/BANDAGES/DRESSINGS) ×1 IMPLANT
PAD OB MATERNITY 4.3X12.25 (PERSONAL CARE ITEMS) ×2 IMPLANT
RTRCTR C-SECT PINK 25CM LRG (MISCELLANEOUS) IMPLANT
STRIP CLOSURE SKIN 1/2X4 (GAUZE/BANDAGES/DRESSINGS) IMPLANT
SUT MON AB-0 CT1 36 (SUTURE) ×4 IMPLANT
SUT PLAIN 0 NONE (SUTURE) IMPLANT
SUT PLAIN 2 0 (SUTURE)
SUT PLAIN ABS 2-0 CT1 27XMFL (SUTURE) IMPLANT
SUT VIC AB 0 CT1 27 (SUTURE) ×4
SUT VIC AB 0 CT1 27XBRD ANBCTR (SUTURE) ×2 IMPLANT
SUT VIC AB 2-0 CT1 27 (SUTURE) ×2
SUT VIC AB 2-0 CT1 TAPERPNT 27 (SUTURE) ×1 IMPLANT
SUT VIC AB 4-0 KS 27 (SUTURE) ×2 IMPLANT
SUT VICRYL 0 TIES 12 18 (SUTURE) IMPLANT
TOWEL OR 17X24 6PK STRL BLUE (TOWEL DISPOSABLE) ×2 IMPLANT
TRAY FOLEY BAG SILVER LF 14FR (SET/KITS/TRAYS/PACK) IMPLANT

## 2016-10-18 NOTE — Lactation Note (Signed)
This note was copied from a baby's chart. Lactation Consultation Note  Patient Name: Boy Cherrell Maybee [id number] Date: 10/18/2016 Reason for consult: Initial assessment Baby at 11 hr of life. Upon entry baby was sleeping in mom's arms. Experienced bf mom reports baby is latching well. She denies breast or nipple pain, voiced no concerns. Discussed baby behavior, feeding frequency, baby belly size, voids, wt loss, breast changes, and nipple care. Mom stated she can manually express and has a spoon at the bedside. Given lactation handouts. Aware of OP services and support group.    Maternal Data Has patient been taught Hand Expression?: Yes Does the patient have breastfeeding experience prior to this delivery?: Yes  Feeding    LATCH Score                   Interventions    Lactation Tools Discussed/Used WIC Program: No   Consult Status Consult Status: Follow-up Date: 10/19/16 Follow-up type: In-patient    Rulon Eisenmenger 10/18/2016, 7:10 PM

## 2016-10-18 NOTE — H&P (Signed)
Elizabeth Garrison is a 31 y.o. female presenting for primary C/section due to h/o shoulder dystocia in both her prior pregnancies and LGA estimated this time.  Uncomplicated pregnancy.  Zoloft for anxiety/depression  OB History    Gravida Para Term Preterm AB Living   0 0 2   SAB TAB Ectopic Multiple Live Births   0 0 0 0 2     Past Medical History:  Diagnosis Date  . Anxiety    as a teen  . Asthma 02/08/11  . H/O cystitis   . H/O pyelonephritis    as adolescent  . H/O varicella   . History of kidney stones    age 67  . Yeast infection    Past Surgical History:  Procedure Laterality Date  . TONSILLECTOMY     age 33  . WISDOM TOOTH EXTRACTION  age 43   Family History: family history includes Asthma in her brother and mother; Cancer in her maternal grandfather and paternal grandmother; Hypertension in her maternal grandmother, maternal uncle, and mother; Other in her mother; Thyroid disease in her maternal uncle and mother. Social History:  reports that she has never smoked. She has never used smokeless tobacco. She reports that she does not drink alcohol or use drugs.     Maternal Diabetes: No Genetic Screening: Normal Maternal Ultrasounds/Referrals: Normal Fetal Ultrasounds or other Referrals:  None Maternal Substance Abuse:  No Significant Maternal Medications:  Meds include: Zoloft Significant Maternal Lab Results:  Lab values include: Group B Strep negative Other Comments:  None  ROS  Neg  History   currently breastfeeding. Exam Physical Exam  There were no vitals taken for this visit.  A&O x 3, no acute distress. Pleasant HEENT neg, no thyromegaly Lungs CTA bilat CV RRR, S1S2 normal Abdo soft, non tender, non acute Extr no edema/ tenderness Pelvic 3/ 50%/ floating/ VTX FHT 140s Toco ireg   Prenatal labs: ABO, Rh: --/--/A POS (10/10 1100) Antibody: NEG (10/10 1059) Rubella: Immune  RPR: Non Reactive (10/10 1100)  HBsAg: Negative (03/23 0000)   HIV: Non-reactive (03/23 0000)  GBS: Negative (09/20 0000)  Glucola normal   Assessment/Plan: 31 yo, G3 P2, healthy female, history of shoulder dystocia 2, here for primary elective C-section at 39 weeks. Risks/complications of surgery reviewed incl infection, bleeding, damage to internal organs including bladder, bowels, ureters, blood vessels, other risks from anesthesia, VTE and delayed complications of any surgery, complications in future surgery reviewed. Also discussed neonatal complications incl difficult delivery, laceration, vacuum assistance, TTN etc. Pt understands and agrees, all concerns addressed.     Laney Bagshaw R 10/18/2016, 6:27 AM

## 2016-10-18 NOTE — Transfer of Care (Signed)
Immediate Anesthesia Transfer of Care Note  Patient: Elizabeth Garrison  Procedure(s) Performed: Primary CESAREAN SECTION (N/A )  Patient Location: PACU  Anesthesia Type:Spinal  Level of Consciousness: awake, alert , oriented and patient cooperative  Airway & Oxygen Therapy: Patient Spontanous Breathing  Post-op Assessment: Report given to RN and Post -op Vital signs reviewed and stable  Post vital signs: Reviewed and stable  Last Vitals:  Vitals:   10/18/16 0626 10/18/16 0856  BP: 137/83 (!) 136/91  Pulse: 81 75  Resp: 18 13  Temp:  36.9 C  SpO2:  100%    Last Pain:  Vitals:   10/18/16 0856  PainSc: (P) 0-No pain         Complications: No apparent anesthesia complications

## 2016-10-18 NOTE — Op Note (Signed)
Cesarean Section Procedure Note  10/18/2016  Elizabeth Garrison  Procedure: Primary elective low transverse cesarean section  Indications: History of shoulder dystocia in prior 2 pregnancies   Pre-operative Diagnosis: History of Shoulder Dystocia. 39 weeks   Post-operative Diagnosis: Same   Surgeon:  Shea Evans, MD    Assistants: Carlean Jews, CNM  Anesthesia: spinal   Procedure Details:  The patient was seen in the Holding Room. The risks, benefits, complications, treatment options, and expected outcomes were discussed with the patient. The patient concurred with the proposed plan, giving informed consent. identified as Elizabeth Garrison and the procedure verified as C-Section Delivery. A Time Out was held and the above information confirmed. 2 gm Ancef given.  After induction of anesthesia, the patient was draped and prepped in the usual sterile manner and foley was placed. Pfannenstiel Incision was made and carried down through the subcutaneous tissue to the fascia. Fascial incision was made and extended transversely. The fascia was separated from the underlying rectus tissue superiorly and inferiorly. The peritoneum was identified and entered. Peritoneal incision was extended longitudinally. Alexis retractor placed. The utero-vesical peritoneal reflection was incised transversely and the bladder flap was bluntly freed from the lower uterine segment. A low transverse uterine incision was made. Clear amniotic fluid drained. Delivered from cephalic presentation was a vigorous BOY. Delayed cord clamping done at 1 minute and baby handed to NICU team in attendance. Apgar scores of 9 at one minute and 9 at five minutes. Cord ph was not sent. Cord blood was obtained for evaluation. The placenta was removed Intact and appeared normal and large. The uterine outline, tubes and ovaries appeared normal}. The uterine incision was closed with running locked sutures of 0-Monocryl followed by a second  imbricating layer. Hemostasis was observed. Alexis retractor removed. Peritoneal closure done with 2-0 Vicryl. The fascia was then reapproximated with running sutures of 0Vicryl. The subcuticular closure was performed using 2-0plain gut. The skin was closed with 4-0Vicryl. Sterile dressings placed.   Instrument, sponge, and needle counts were correct prior the abdominal closure and were correct at the conclusion of the case.   Findings: BOY, delivered from [facility] hysterotomy at 7.55 am with Apgars 9 and 9. Normal uterus, tubes, ovaries. 2 layer closure of hysterotomy.    Estimated Blood Loss: 970 cc  Total IV Fluids:  2400 cc LR  Urine Output: 50CC OF clear urine  Specimens: Cord blood  Complications: no complications  Disposition: PACU - hemodynamically stable.   Maternal Condition: stable   Baby condition / location:  Couplet care / Skin to Skin  Attending Attestation: I performed the procedure.   Signed: Surgeon(s): Shea Evans, MD

## 2016-10-18 NOTE — Anesthesia Procedure Notes (Addendum)
Spinal  Patient location during procedure: OR Start time: 10/18/2016 7:30 AM End time: 10/18/2016 7:34 AM Staffing Anesthesiologist: Cristela Blue Performed: anesthesiologist  Preanesthetic Checklist Completed: patient identified, site marked, surgical consent, pre-op evaluation, timeout performed, IV checked, risks and benefits discussed and monitors and equipment checked Spinal Block Patient position: sitting Prep: DuraPrep Patient monitoring: heart rate, continuous pulse ox, blood pressure and cardiac monitor Approach: right paramedian Location: L3-4 Injection technique: single-shot Needle Needle type: Spinocan  Needle gauge: 24 G Needle length: 9 cm Assessment Sensory level: T4

## 2016-10-19 LAB — CBC
HCT: 31.6 % — ABNORMAL LOW (ref 36.0–46.0)
Hemoglobin: 10.7 g/dL — ABNORMAL LOW (ref 12.0–15.0)
MCH: 29.6 pg (ref 26.0–34.0)
MCHC: 33.9 g/dL (ref 30.0–36.0)
MCV: 87.5 fL (ref 78.0–100.0)
Platelets: 150 10*3/uL (ref 150–400)
RBC: 3.61 MIL/uL — ABNORMAL LOW (ref 3.87–5.11)
RDW: 13.8 % (ref 11.5–15.5)
WBC: 8.9 10*3/uL (ref 4.0–10.5)

## 2016-10-19 NOTE — Anesthesia Postprocedure Evaluation (Signed)
Anesthesia Post Note  Patient: Elizabeth Garrison  Procedure(s) Performed: Primary CESAREAN SECTION (N/A )     Patient location during evaluation: PACU Anesthesia Type: Spinal Level of consciousness: awake Pain management: satisfactory to patient Vital Signs Assessment: post-procedure vital signs reviewed and stable Respiratory status: spontaneous breathing Cardiovascular status: blood pressure returned to baseline Postop Assessment: no headache and spinal receding Anesthetic complications: no    Last Vitals:  Vitals:   10/19/16 0040 10/19/16 0500  BP: 120/68 117/75  Pulse: 68 66  Resp: 16 18  Temp: 37.1 C 37.1 C  SpO2: 100%     Last Pain:  Vitals:   10/19/16 1225  TempSrc:   PainSc: 3                  Elizabeth Garrison

## 2016-10-19 NOTE — Progress Notes (Signed)
C/o right shoulder pain; some nausea; tol po, voiding w/o difficulty; normal lochia; passing flatus; pain controlled  Temp:  [98.1 F (36.7 C)-98.8 F (37.1 C)] 98.7 F (37.1 C) (10/12 0500) Pulse Rate:  [56-74] 66 (10/12 0500) Resp:  [16-20] 18 (10/12 0500) BP: (108-126)/(64-75) 117/75 (10/12 0500) SpO2:  [95 %-100 %] 100 % (10/12 0040)  Intake/Output Summary (Last 24 hours) at 10/19/16 1230 Last data filed at 10/19/16 0100  Gross per 24 hour  Intake           352.08 ml  Output             5450 ml  Net         -5097.92 ml   A&ox3 rrr ctab Abd: soft, nt, fundus firm; +bs Dressing: tegaderm has pulled away from incision - will change honeycomb dressing LE: no edema, nt bilat le  CBC Latest Ref Rng & Units 10/19/2016 10/17/2016 10/12/2016  WBC 4.0 - 10.5 K/uL 8.9 7.2 8.9  Hemoglobin 12.0 - 15.0 g/dL 10.7(L) 10.7(L) 11.1(L)  Hematocrit 36.0 - 46.0 % 31.6(L) 31.6(L) 33.0(L)  Platelets 150 - 400 K/uL 150 158 177   A/p: pod 1 s/p ltcs 1. Doing well, contin care; encourage ambulation and frequent meals; plan d/c home in 1 day 2. Mild acute anemia - iron q day pp

## 2016-10-19 NOTE — Lactation Note (Signed)
This note was copied from a baby's chart. Lactation Consultation Note  Patient Name: Elizabeth Garrison [id number] Date: 10/19/2016 Reason for consult: Follow-up assessment   Follow up with Exp BF mom of 32 hour old infant. Infant with 13 BF for 10-25 minutes, 1 attempt, 4 voids and 4 stools in last 24 hours. Infant weight 9 lb 1.3 oz with 5% weight loss since birth. LATCH scores 8-9.   Mom reports she feels BF is going well. She reports infant has been sleepy since circumcision. Mom reports she is able to hand express colostrum, enc her to spoon feed infant Colostrum if he wont latch or to see if it will wake him up to latch. Mom voiced understanding. Mom reports nipple tenderness with initial latch, advised her to apply EBM to nipples post BF.   Mom without any questions/concerns at this time. Mom to call out for feeding assistance as needed.    Maternal Data Formula Feeding for Exclusion: No Has patient been taught Hand Expression?: Yes Does the patient have breastfeeding experience prior to this delivery?: Yes  Feeding Feeding Type: Breast Fed  LATCH Score                   Interventions    Lactation Tools Discussed/Used     Consult Status Consult Status: Follow-up Date: 10/20/16 Follow-up type: In-patient    Elizabeth Garrison 10/19/2016, 4:05 PM

## 2016-10-19 NOTE — Progress Notes (Signed)
MOB was referred for history of depression/anxiety.  Referral is screened out by Clinical Social Worker because none of the following criteria appear to apply and there are no reports impacting the pregnancy or her transition to the postpartum period.  CSW does not deem it clinically necessary to further investigate at this time.   -History of anxiety/depression during this pregnancy, or of post-partum depression.  - Diagnosis of anxiety and/or depression within last 3 years.-  - History of depression due to pregnancy loss/loss of child or -MOB's symptoms are currently being treated with medication and/or therapy. (MOB on Zoloft)   Please contact the Clinical Social Worker if needs arise or upon MOB request.    Lyza Houseworth, MSW, LCSW-A Clinical Social Worker  [facility]  Office: [phone]   
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
## 2016-10-20 NOTE — Lactation Note (Addendum)
This note was copied from a baby's chart. Lactation Consultation Note  Patient Name: Elizabeth Garrison [id number] Date: 10/20/2016 Reason for consult: Follow-up assessment   Consult due to request.  Mother has baby latched upon entering fully dressed and sleepy at the breast. Intermittent sucks and swallows observed. Mother's breasts are filling.  L nipple has abrasion on tip.  R nipple pink w/ small blister. Mother asked questions about breasts filling.  Suggest waking baby and allowing him to empty breast. Massage/compress breast while he is breastfeeding. Undressed baby and mother relatched on L side. Provided mother with comfort gels and hand pump. Allow baby to empty breast on demand. Suggest asking for ice if breast becomes engorged and mother can post pump if needed (only 5-7 min to soften)/lie flat and massage backwards. Mom encouraged to feed baby 8-12 times/24 hours and with feeding cues.  Reviewed engorgement care and monitoring voids/stools.    Maternal Data    Feeding Feeding Type: Breast Fed Length of feed: 20 min  LATCH Score Latch: Grasps breast easily, tongue down, lips flanged, rhythmical sucking.  Audible Swallowing: A few with stimulation  Type of Nipple: Everted at rest and after stimulation  Comfort (Breast/Nipple): Filling, red/small blisters or bruises, mild/mod discomfort (per mom for right nipple blister and left has blister)  Hold (Positioning): Assistance needed to correctly position infant at breast and maintain latch. (Rn attending to pull down  lower lip)  LATCH Score: 7  Interventions Interventions: Breast feeding basics reviewed;Comfort gels;Hand pump  Lactation Tools Discussed/Used     Consult Status Consult Status: Complete    Hardie Pulley 10/20/2016, 8:47 PM

## 2016-10-21 MED ORDER — COCONUT OIL OIL: 1.0000 "application " | TOPICAL_OIL | 0 refills | Status: DC | PRN

## 2016-10-21 MED ORDER — ACETAMINOPHEN 500 MG PO TABS: 1000.0000 mg | ORAL_TABLET | Freq: Four times a day (QID) | ORAL | 0 refills | Status: DC | PRN

## 2016-10-21 MED ORDER — IBUPROFEN 600 MG PO TABS
600.0000 mg | ORAL_TABLET | Freq: Four times a day (QID) | ORAL | 0 refills | Status: DC
Start: 2016-10-21 — End: 2019-12-09

## 2016-10-21 MED ORDER — OXYCODONE HCL 5 MG PO TABS: 5.0000 mg | ORAL_TABLET | ORAL | 0 refills | Status: DC | PRN

## 2016-10-21 MED ORDER — SIMETHICONE 80 MG PO CHEW: 80.0000 mg | CHEWABLE_TABLET | Freq: Three times a day (TID) | ORAL | 0 refills | Status: DC

## 2016-10-21 MED ORDER — SENNOSIDES-DOCUSATE SODIUM 8.6-50 MG PO TABS: 2.0000 | ORAL_TABLET | ORAL | Status: DC

## 2016-10-21 NOTE — Lactation Note (Signed)
This note was copied from a baby's chart. Lactation Consultation Note: Mother reports that infant is breastfeeding well. Mother reports that her nipples were sore but comfort gels are helping. Mother advised in treatment and prevention of engorgement. Mother denies having any questions or concerns. Mother is aware of available LC services . Mother is receptive to all teaching.   Patient Name: Boy Ashonti Leandro [id number] Date: 10/21/2016 Reason for consult: Follow-up assessment   Maternal Data    Feeding Length of feed: 10 min  LATCH Score                   Interventions    Lactation Tools Discussed/Used     Consult Status Consult Status: Complete    Michel Bickers 10/21/2016, 10:41 AM

## 2016-10-21 NOTE — Discharge Summary (Signed)
OB Discharge Summary     Patient Name: Elizabeth Garrison DOB: 1985-04-13 MRN: [id number]  Date of admission: 10/18/2016 Delivering MD: MODY, VAISHALI   Date of discharge: 10/21/2016  Admitting diagnosis: History of Shoulder Dystocia Intrauterine pregnancy: [redacted]w[redacted]d     Secondary diagnosis:  Principal Problem:   Postpartum care following cesarean delivery (10/11) Active Problems:   Cesarean delivery delivered: Indication: history of shoulder dystocia x 2; LGA    Discharge diagnosis: Term Pregnancy Delivered                                   Complications: None  Hospital course:  Sceduled C/S   31 y.o. yo G3P2003 at [redacted]w[redacted]d was admitted to the hospital 10/18/2016 for scheduled cesarean section with the following indication:Elective Primary and history LGA with shoulder ditocia x 2. .  Membrane Rupture Time/Date: 7:54 AM ,10/18/2016   Patient delivered a Viable infant.10/18/2016  Details of operation can be found in separate operative note.  Pateint had an uncomplicated postpartum course.  She is ambulating, tolerating a regular diet, passing flatus, and urinating well. Patient is discharged home in stable condition on  10/21/16         Physical exam  Vitals:   10/19/16 1800 10/20/16 0620 10/20/16 1900 10/21/16 0704  BP:  114/70 122/64 125/89  Pulse: 69 (!) 59 74 66  Resp: Temp: 98.9 F (37.2 C) 97.8 F (36.6 C) 97.9 F (36.6 C) 98.1 F (36.7 C)  TempSrc: Oral Oral Oral Oral  SpO2:   99%   Weight:      Height:       General: alert, cooperative and no distress Lochia: appropriate Uterine Fundus: firm Incision: Healing well with no significant drainage DVT Evaluation: No cords or calf tenderness. No significant calf/ankle edema. Labs: Lab Results  Component Value Date   WBC 8.9 10/19/2016   HGB 10.7 (L) 10/19/2016   HCT 31.6 (L) 10/19/2016   MCV 87.5 10/19/2016   PLT 150 10/19/2016   CMP Latest Ref Rng & Units 10/12/2016  Glucose 65 - 99 mg/dL 99  BUN 6  - 20 mg/dL 12  Creatinine 6.29 - 5.28 mg/dL 4.13  Sodium 244 - 010 mmol/L 134(L)  Potassium 3.5 - 5.1 mmol/L 3.5  Chloride 101 - 111 mmol/L 106  CO2 22 - 32 mmol/L 21(L)  Calcium 8.9 - 10.3 mg/dL 2.7(O)  Total Protein 6.5 - 8.1 g/dL 6.5  Total Bilirubin 0.3 - 1.2 mg/dL 0.7  Alkaline Phos 38 - 126 U/L 148(H)  AST 15 - 41 U/L 21  ALT 14 - 54 U/L 14    Discharge instruction: per After Visit Summary and "Baby and Me Booklet".  After visit meds:  Allergies as of 10/21/2016   No Known Allergies     Medication List    STOP taking these medications   doxylamine (Sleep) 25 MG tablet Commonly known as:  UNISOM     TAKE these medications   acetaminophen 500 MG tablet Commonly known as:  TYLENOL Take 2 tablets (1,000 mg total) by mouth every 6 (six) hours as needed for mild pain. What changed:  how much to take  when to take this   coconut oil Oil Apply 1 application topically as needed.   ibuprofen 600 MG tablet Commonly known as:  ADVIL,MOTRIN Take 1 tablet (600 mg total) by mouth every 6 (six) hours.  oxyCODONE 5 MG immediate release tablet Commonly known as:  Oxy IR/ROXICODONE Take 1 tablet (5 mg total) by mouth every 4 (four) hours as needed (pain scale 4-7).   prenatal multivitamin Tabs tablet Take 1 tablet by mouth 3 (three) times a week.   PRILOSEC PO Take 1 tablet by mouth daily.   senna-docusate 8.6-50 MG tablet Commonly known as:  Senokot-S Take 2 tablets by mouth daily.   sertraline 100 MG tablet Commonly known as:  ZOLOFT Take 100 mg by mouth at bedtime.   simethicone 80 MG chewable tablet Commonly known as:  MYLICON Chew 1 tablet (80 mg total) by mouth 3 (three) times daily after meals.       Diet: routine diet  Activity: Advance as tolerated. Pelvic rest for 6 weeks.   Outpatient follow up:6 weeks at [facility]  Postpartum contraception: Not Discussed  Newborn Data: Live born female  Birth Weight: 9 lb 8.6 oz (4325 g) APGAR: 9,  9  Newborn Delivery   Birth date/time:  10/18/2016 07:55:00 Delivery type:  C-Section, Low Transverse  C-section categorization:  Primary     Baby Feeding: Breast Disposition:home with mother   10/21/2016 Neta Mends, CNM

## 2016-11-28 DIAGNOSIS — F411 Generalized anxiety disorder: Secondary | ICD-10-CM | POA: Diagnosis not present

## 2016-12-03 DIAGNOSIS — Z124 Encounter for screening for malignant neoplasm of cervix: Secondary | ICD-10-CM | POA: Diagnosis not present

## 2016-12-04 DIAGNOSIS — J029 Acute pharyngitis, unspecified: Secondary | ICD-10-CM | POA: Diagnosis not present

## 2016-12-12 DIAGNOSIS — N393 Stress incontinence (female) (male): Secondary | ICD-10-CM | POA: Diagnosis not present

## 2016-12-12 DIAGNOSIS — M545 Low back pain: Secondary | ICD-10-CM | POA: Diagnosis not present

## 2016-12-12 DIAGNOSIS — M6200 Separation of muscle (nontraumatic), unspecified site: Secondary | ICD-10-CM | POA: Diagnosis not present

## 2016-12-12 DIAGNOSIS — L905 Scar conditions and fibrosis of skin: Secondary | ICD-10-CM | POA: Diagnosis not present

## 2017-01-02 DIAGNOSIS — L905 Scar conditions and fibrosis of skin: Secondary | ICD-10-CM | POA: Diagnosis not present

## 2017-01-02 DIAGNOSIS — N393 Stress incontinence (female) (male): Secondary | ICD-10-CM | POA: Diagnosis not present

## 2017-01-02 DIAGNOSIS — M545 Low back pain: Secondary | ICD-10-CM | POA: Diagnosis not present

## 2017-01-02 DIAGNOSIS — M6200 Separation of muscle (nontraumatic), unspecified site: Secondary | ICD-10-CM | POA: Diagnosis not present

## 2017-08-19 DIAGNOSIS — O9122 Nonpurulent mastitis associated with the puerperium: Secondary | ICD-10-CM | POA: Diagnosis not present

## 2017-11-05 DIAGNOSIS — L700 Acne vulgaris: Secondary | ICD-10-CM | POA: Diagnosis not present

## 2018-01-11 DIAGNOSIS — J1189 Influenza due to unidentified influenza virus with other manifestations: Secondary | ICD-10-CM | POA: Diagnosis not present

## 2018-01-11 DIAGNOSIS — J029 Acute pharyngitis, unspecified: Secondary | ICD-10-CM | POA: Diagnosis not present

## 2018-01-11 DIAGNOSIS — Z20828 Contact with and (suspected) exposure to other viral communicable diseases: Secondary | ICD-10-CM | POA: Diagnosis not present

## 2018-01-14 DIAGNOSIS — L700 Acne vulgaris: Secondary | ICD-10-CM | POA: Diagnosis not present

## 2018-02-14 ENCOUNTER — Other Ambulatory Visit: Payer: Self-pay

## 2018-02-14 MED ORDER — SERTRALINE HCL 100 MG PO TABS: 150.0000 mg | ORAL_TABLET | Freq: Every day | ORAL | 0 refills | Status: DC

## 2018-02-17 ENCOUNTER — Encounter: Payer: Self-pay | Admitting: Psychiatry

## 2018-02-17 ENCOUNTER — Ambulatory Visit: Payer: BLUE CROSS/BLUE SHIELD | Admitting: Psychiatry

## 2018-02-17 VITALS — BP 106/72 | HR 68 | Ht 64.0 in | Wt 134.0 lb

## 2018-02-17 DIAGNOSIS — F3341 Major depressive disorder, recurrent, in partial remission: Secondary | ICD-10-CM

## 2018-02-17 DIAGNOSIS — F422 Mixed obsessional thoughts and acts: Secondary | ICD-10-CM

## 2018-02-17 DIAGNOSIS — F410 Panic disorder [episodic paroxysmal anxiety] without agoraphobia: Secondary | ICD-10-CM | POA: Diagnosis not present

## 2018-02-17 MED ORDER — SERTRALINE HCL 100 MG PO TABS: 150.0000 mg | ORAL_TABLET | Freq: Every day | ORAL | 3 refills | Status: DC

## 2018-02-17 NOTE — Progress Notes (Signed)
[facility] Check  Patient ID: Elizabeth Garrison,  MRN: [id number]  PCP: Jaymes Graff, MD  Date of Evaluation: 02/17/2018 Time spent:10 minutes  Chief Complaint:  Chief Complaint    Anxiety; Depression      HISTORY/CURRENT STATUS: Elizabeth Garrison is seen individually face-to-face with consent not collateral for psychiatric interview and exam in 71-month evaluation and management of OCD including hair pulling, history of panic disorder, and history of major depression.  She is 16 months postpartum noting that postpartum depressive symptoms were modest for 3 to 6 months then resolved taking no additional treatment for that other than continuing her responsibilities.  She works out at [employer] and continues breast-feeding at times not often but has not had Xanax in over a year including for that reason.  She is reasonably confident therefore that the OCD is stable on Zoloft sufficiently to cover any panic or depression currently in remission.  She does not plan any further pregnancy. She notes stress increases her OCD especially from family in the workplace.  Anxiety  Presents for follow-up visit. Symptoms include compulsions, nervous/anxious behavior and obsessions. Patient reports no confusion, decreased concentration, depressed mood, insomnia, muscle tension, nausea, palpitations, panic, shortness of breath or suicidal ideas. Symptoms occur occasionally. The severity of symptoms is moderate. The quality of sleep is fair. Nighttime awakenings: occasional.   Compliance with medications is 76-100%.    Individual Medical History/ Review of Systems: Changes? :No   Allergies: Patient has no known allergies.  Current Medications:  Current Outpatient Medications:  .  acetaminophen (TYLENOL) 500 MG tablet, Take 2 tablets (1,000 mg total) by mouth every 6 (six) hours as needed for mild pain., Disp: 30 tablet, Rfl: 0 .  coconut oil OIL, Apply 1 application topically as needed., Disp: , Rfl: 0 .   ibuprofen (ADVIL,MOTRIN) 600 MG tablet, Take 1 tablet (600 mg total) by mouth every 6 (six) hours., Disp: 30 tablet, Rfl: 0 .  Omeprazole (PRILOSEC PO), Take 1 tablet by mouth daily., Disp: , Rfl:  .  oxyCODONE (OXY IR/ROXICODONE) 5 MG immediate release tablet, Take 1 tablet (5 mg total) by mouth every 4 (four) hours as needed (pain scale 4-7)., Disp: 30 tablet, Rfl: 0 .  Prenatal Vit-Fe Fumarate-FA (PRENATAL MULTIVITAMIN) TABS, Take 1 tablet by mouth 3 (three) times a week. , Disp: , Rfl:  .  senna-docusate (SENOKOT-S) 8.6-50 MG tablet, Take 2 tablets by mouth daily., Disp: , Rfl:  .  sertraline (ZOLOFT) 100 MG tablet, Take 1.5 tablets (150 mg total) by mouth at bedtime., Disp: 135 tablet, Rfl: 3 .  simethicone (MYLICON) 80 MG chewable tablet, Chew 1 tablet (80 mg total) by mouth 3 (three) times daily after meals., Disp: 30 tablet, Rfl: 0   Medication Side Effects: none  Family Medical/ Social History: Changes? No  MENTAL HEALTH EXAM: Muscle strengths and tone 5/5, postural reflexes and gait 0/0, and AIMS = 0. Blood pressure 106/72, pulse 68, height 5\' 4"  (1.626 m), weight 134 lb (60.8 kg), unknown if currently breastfeeding.Body mass index is 23 kg/m.  General Appearance: Casual and Fairly Groomed  Eye Contact:  Good  Speech:  Clear and Coherent and Normal Rate  Volume:  Normal  Mood:  Anxious and Euthymic  Affect:  Appropriate, Full Range and Anxious  Thought Process:  Goal Directed and Linear  Orientation:  Full (Time, Place, and Person)  Thought Content: Obsessions   Suicidal Thoughts:  No  Homicidal Thoughts:  No  Memory:  Immediate;   Good  Remote;   Good  Judgement:  Good  Insight:  Good  Psychomotor Activity:  Normal  Concentration:  Concentration: Good and Attention Span: Good  Recall:  Good  Fund of Knowledge: Good  Language: Good  Assets:  Physical Health Resilience Talents/Skills  ADL's:  Intact  Cognition: WNL  Prognosis:  Good    DIAGNOSES:    ICD-10-CM    1. Mixed obsessional thoughts and acts F42.2 sertraline (ZOLOFT) 100 MG tablet  2. Panic disorder F41.0 sertraline (ZOLOFT) 100 MG tablet  3. Recurrent major depressive disorder, in partial remission (HCC) F33.41 sertraline (ZOLOFT) 100 MG tablet    Receiving Psychotherapy: No    RECOMMENDATIONS: She is escribed Zoloft 100 mg tablet as 1-1/2 tablets total 150 mg every bedtime #135 with [facility] [address] for OCD and remitted panic and depression.  Exposure response prevention is helping off Xanax to return in 12 months or sooner if needed.   Chauncey MannGlenn E Kaidin Boehle, MD

## 2018-06-24 DIAGNOSIS — L700 Acne vulgaris: Secondary | ICD-10-CM | POA: Diagnosis not present

## 2018-06-24 DIAGNOSIS — Z1159 Encounter for screening for other viral diseases: Secondary | ICD-10-CM | POA: Diagnosis not present

## 2018-08-22 ENCOUNTER — Other Ambulatory Visit: Payer: Self-pay

## 2018-08-22 DIAGNOSIS — Z20822 Contact with and (suspected) exposure to covid-19: Secondary | ICD-10-CM

## 2018-08-23 LAB — NOVEL CORONAVIRUS, NAA: SARS-CoV-2, NAA: NOT DETECTED

## 2018-12-19 DIAGNOSIS — Z209 Contact with and (suspected) exposure to unspecified communicable disease: Secondary | ICD-10-CM | POA: Diagnosis not present

## 2019-01-20 ENCOUNTER — Ambulatory Visit (INDEPENDENT_AMBULATORY_CARE_PROVIDER_SITE_OTHER): Payer: BC Managed Care – PPO | Admitting: Psychiatry

## 2019-01-20 ENCOUNTER — Encounter: Payer: Self-pay | Admitting: Psychiatry

## 2019-01-20 VITALS — Ht 64.0 in | Wt 136.0 lb

## 2019-01-20 DIAGNOSIS — F422 Mixed obsessional thoughts and acts: Secondary | ICD-10-CM | POA: Diagnosis not present

## 2019-01-20 DIAGNOSIS — F3341 Major depressive disorder, recurrent, in partial remission: Secondary | ICD-10-CM

## 2019-01-20 DIAGNOSIS — F410 Panic disorder [episodic paroxysmal anxiety] without agoraphobia: Secondary | ICD-10-CM

## 2019-01-20 DIAGNOSIS — Z20828 Contact with and (suspected) exposure to other viral communicable diseases: Secondary | ICD-10-CM | POA: Diagnosis not present

## 2019-01-20 DIAGNOSIS — Z6823 Body mass index (BMI) 23.0-23.9, adult: Secondary | ICD-10-CM | POA: Diagnosis not present

## 2019-01-20 MED ORDER — ALPRAZOLAM 0.5 MG PO TABS: 0.5000 mg | ORAL_TABLET | Freq: Two times a day (BID) | ORAL | 0 refills | Status: DC | PRN

## 2019-01-20 MED ORDER — SERTRALINE HCL 100 MG PO TABS: 150.0000 mg | ORAL_TABLET | Freq: Every day | ORAL | 3 refills | Status: DC

## 2019-01-20 NOTE — Progress Notes (Signed)
[facility]  Patient ID: Elizabeth Garrison,  MRN: [id number]  PCP: Jaymes Graff, MD  Date of Evaluation: 01/20/2019 Time spent:20 minutes from 1000 to 1020  Chief Complaint:  Chief Complaint    Anxiety; Panic Attack; Depression; Stress      HISTORY/CURRENT STATUS: Elizabeth Garrison is provided telemedicine audiovisual appointment session, declining the video camera at last moment as she is bringing her son home from Covid testing, phone to phone with consent privacy other than her child with epic collateral for psychiatric interview and exam in 14-month evaluation and management of OCD, panic disorder, and recurrent major depression in remission.  As her aunt tested positive for Covid apparently having recent holiday exposure for son Elizabeth Garrison, Elizabeth Garrison notes that despite her meditation and working out continuing her Zoloft 150 mg every bedtime, she is having an exacerbation of obsessive-compulsive fixations and prepanic anxiety.  She is having obsessional thoughts on sickness triggering compulsive checking regarding her children being stressed out now.  After pre or actual panic attacks, she is exhausted and feeling sick and tired the next day.  She has discontinuation symptoms when she misses or is late for a dose of Zoloft, therefore she hesitates to go up on the Zoloft in ways that might intensify such discontinuation symptoms.  Husband has a vasectomy so she has no chance of pregnancy.  She is otherwise generally healthy gaining a couple of pounds over the holiday.  She reviews the security of her therapeutics in the family system.  She has only yearly appointments as she overall manages course of her symptoms treated in this office since 2011 initially under the care of Dr. Tomasa Rand.  She has no mania, suicidality, psychosis or delirium.  Anxiety  Presents for follow-up visit. Symptoms include compulsions, nervous/anxious behavior, excessive worry, panic, , muscle tension, nausea, and  obsessions. Patient reports no confusion, decreased concentration, depressed mood, insomnia palpitations, shortness of breath or suicidal ideas. Symptoms occur occasionally. The severity of symptoms is moderate. The quality of sleep is fair. Nighttime awakenings: occasional. Compliance with medications is 76-100%.   Individual Medical History/ Review of Systems: Changes? :Yes Weight up 136 from 134 last year having some discontinuation symptoms went late for Zoloft otherwise no medical concerns husband having had a vasectomy after last pregnancy.  Allergies: Patient has no known allergies.  Current Medications:  Current Outpatient Medications:  .  acetaminophen (TYLENOL) 500 MG tablet, Take 2 tablets (1,000 mg total) by mouth every 6 (six) hours as needed for mild pain., Disp: 30 tablet, Rfl: 0 .  ALPRAZolam (XANAX) 0.5 MG tablet, Take 1 tablet (0.5 mg total) by mouth 2 (two) times daily as needed for anxiety (Panic attack)., Disp: 180 tablet, Rfl: 0 .  coconut oil OIL, Apply 1 application topically as needed., Disp: , Rfl: 0 .  ibuprofen (ADVIL,MOTRIN) 600 MG tablet, Take 1 tablet (600 mg total) by mouth every 6 (six) hours., Disp: 30 tablet, Rfl: 0 .  Omeprazole (PRILOSEC PO), Take 1 tablet by mouth daily., Disp: , Rfl:  .  oxyCODONE (OXY IR/ROXICODONE) 5 MG immediate release tablet, Take 1 tablet (5 mg total) by mouth every 4 (four) hours as needed (pain scale 4-7)., Disp: 30 tablet, Rfl: 0 .  Prenatal Vit-Fe Fumarate-FA (PRENATAL MULTIVITAMIN) TABS, Take 1 tablet by mouth 3 (three) times a week. , Disp: , Rfl:  .  senna-docusate (SENOKOT-S) 8.6-50 MG tablet, Take 2 tablets by mouth daily., Disp: , Rfl:  .  sertraline (ZOLOFT) 100 MG tablet,  Take 1.5 tablets (150 mg total) by mouth at bedtime., Disp: 135 tablet, Rfl: 3 .  simethicone (MYLICON) 80 MG chewable tablet, Chew 1 tablet (80 mg total) by mouth 3 (three) times daily after meals., Disp: 30 tablet, Rfl: 0   Medication Side Effects:  none  Family Medical/ Social History: Changes? No  MENTAL HEALTH EXAM:  Height 5\' 4"  (1.626 m), weight 136 lb (61.7 kg), unknown if currently breastfeeding.Body mass index is 23.34 kg/m.  By stated measurements herself not present in office today  General Appearance: N/A  Eye Contact:  N/A  Speech:  Clear and Coherent, Normal Rate and Talkative  Volume:  Normal  Mood:  Anxious and Euthymic  Affect:  Congruent, Inappropriate, Restricted and Anxious  Thought Process:  Coherent, Goal Directed, Irrelevant and Descriptions of Associations: Circumstantial  Orientation:  Full (Time, Place, and Person)  Thought Content: Ilusions, Obsessions and Rumination   Suicidal Thoughts:  No  Homicidal Thoughts:  No  Memory:  Immediate;   Good Remote;   Good  Judgement:  Good  Insight:  Good  Psychomotor Activity:  N/A  Concentration:  Concentration: Fair and Attention Span: Good  Recall:  Good  Fund of Knowledge: Good  Language: Good  Assets:  Desire for Improvement Resilience Talents/Skills  ADL's:  Intact  Cognition: WNL  Prognosis:  Good    DIAGNOSES:    ICD-10-CM   1. Mixed obsessional thoughts and acts  F42.2 sertraline (ZOLOFT) 100 MG tablet  2. Panic disorder  F41.0 sertraline (ZOLOFT) 100 MG tablet    ALPRAZolam (XANAX) 0.5 MG tablet  3. Depression, major, recurrent, in partial remission (HCC)  F33.41 sertraline (ZOLOFT) 100 MG tablet    Receiving Psychotherapy: No    RECOMMENDATIONS: Psychosupportive psychoeducation updates cognitive behavioral sleep hygiene, social skills, and frustration management for exposure desensitization thought stopping response prevention interventions.  Medications are updated for symptom treatment matching concluding to continue Zoloft 100 mg E scribed as 1.5 tablets total 150 mg every bedtime sent as #135 with 3 refills to [facility] [address] for OCD, panic, and history of major depression in remission.  Last Xanax prescription appears to  have been 11/30/2014 for #30 though usually gets a 90-day supply of her medications with current life stressors favoring the 90-day supply of Xanax 0.5 mg twice daily as needed #180 with no refill sent to [facility] [address] for panic and OCD.  We discussed options of therapy and early return for medication adjustment  but she requires to schedule the routine yearly appointment and return early as needed.  Virtual Visit via Video Note  I connected with Elizabeth Garrison on 01/20/19 at 10:00 AM EST by a video enabled telemedicine application and verified that I am speaking with the correct person using two identifiers.  Location: Patient: Audio only canceling video camera at last moment as driving son home from Covid testing Provider: [facility]   I discussed the limitations of evaluation and management by telemedicine and the availability of in person appointments. The patient expressed understanding and agreed to proceed.  History of Present Illness: 31-month evaluation and management address OCD, panic disorder, and recurrent major depression in remission.  As her aunt tested positive for Covid apparently having recent holiday exposure for son Elizabeth Garrison, Elizabeth Garrison notes that despite her meditation and working out continuing her Zoloft 150 mg every bedtime, she is having an exacerbation of obsessive-compulsive fixations and prepanic anxiety.  She is having obsessional thoughts on  sickness triggering compulsive checking regarding her children being stressed out now   Observations/Objective: Mood:  Anxious and Euthymic  Affect:  Congruent, Inappropriate, Restricted and Anxious  Thought Process:  Coherent, Goal Directed, Irrelevant and Descriptions of Associations: Circumstantial  Orientation:  Full (Time, Place, and Person)  Thought Content: Ilusions, Obsessions and Rumination    Assessment and Plan: Psychosupportive psychoeducation updates cognitive behavioral sleep  hygiene, social skills, and frustration management for exposure desensitization thought stopping response prevention interventions.  Medications are updated for symptom treatment matching concluding to continue Zoloft 100 mg E scribed as 1.5 tablets total 150 mg every bedtime sent as #135 with 3 refills to [facility] for OCD, panic, and history of major depression in remission.  Last Xanax prescription appears to have been 11/30/2014 for #30 though usually gets a 90-day supply of her medications with current life stressors favoring the 90-day supply of Xanax 0.5 mg twice daily as needed #180 with no refill sent to [facility] for panic and OCD.  Follow Up Instructions: We discussed options of therapy and early return for medication adjustment  but she requires to schedule the routine yearly appointment and return early as needed.    I discussed the assessment and treatment plan with the patient. The patient was provided an opportunity to ask questions and all were answered. The patient agreed with the plan and demonstrated an understanding of the instructions.   The patient was advised to call back or seek an in-person evaluation if the symptoms worsen or if the condition fails to improve as anticipated.  I provided 20 minutes of non-face-to-face time during this encounter. [employer] meeting #[id number] Meeting password:  [email or web address]  Delight Hoh, MD  Delight Hoh, MD

## 2019-03-24 DIAGNOSIS — L03213 Periorbital cellulitis: Secondary | ICD-10-CM | POA: Diagnosis not present

## 2019-04-14 DIAGNOSIS — L700 Acne vulgaris: Secondary | ICD-10-CM | POA: Diagnosis not present

## 2019-04-20 ENCOUNTER — Ambulatory Visit: Payer: Self-pay | Attending: Internal Medicine

## 2019-04-29 DIAGNOSIS — L308 Other specified dermatitis: Secondary | ICD-10-CM | POA: Diagnosis not present

## 2019-08-04 DIAGNOSIS — Z20822 Contact with and (suspected) exposure to covid-19: Secondary | ICD-10-CM | POA: Diagnosis not present

## 2019-09-16 DIAGNOSIS — Z20828 Contact with and (suspected) exposure to other viral communicable diseases: Secondary | ICD-10-CM | POA: Diagnosis not present

## 2019-09-21 DIAGNOSIS — R05 Cough: Secondary | ICD-10-CM | POA: Diagnosis not present

## 2019-09-21 DIAGNOSIS — U071 COVID-19: Secondary | ICD-10-CM | POA: Diagnosis not present

## 2019-10-15 DIAGNOSIS — Z0000 Encounter for general adult medical examination without abnormal findings: Secondary | ICD-10-CM | POA: Diagnosis not present

## 2019-10-15 DIAGNOSIS — R5383 Other fatigue: Secondary | ICD-10-CM | POA: Diagnosis not present

## 2019-10-15 DIAGNOSIS — Z13228 Encounter for screening for other metabolic disorders: Secondary | ICD-10-CM | POA: Diagnosis not present

## 2019-10-15 DIAGNOSIS — F3341 Major depressive disorder, recurrent, in partial remission: Secondary | ICD-10-CM | POA: Diagnosis not present

## 2019-10-15 DIAGNOSIS — Z13220 Encounter for screening for lipoid disorders: Secondary | ICD-10-CM | POA: Diagnosis not present

## 2019-10-28 ENCOUNTER — Encounter: Payer: Self-pay | Admitting: Psychiatry

## 2019-11-20 ENCOUNTER — Telehealth: Payer: Self-pay

## 2019-11-20 NOTE — Telephone Encounter (Signed)
NOTES ON FILE FROM  [facility] [phone] SENT REFERRAL TO SCHEDULING 

## 2019-12-07 DIAGNOSIS — H00031 Abscess of right upper eyelid: Secondary | ICD-10-CM | POA: Diagnosis not present

## 2019-12-09 ENCOUNTER — Ambulatory Visit (INDEPENDENT_AMBULATORY_CARE_PROVIDER_SITE_OTHER): Payer: BC Managed Care – PPO | Admitting: Internal Medicine

## 2019-12-09 ENCOUNTER — Other Ambulatory Visit: Payer: Self-pay

## 2019-12-09 ENCOUNTER — Encounter: Payer: Self-pay | Admitting: Internal Medicine

## 2019-12-09 VITALS — BP 124/84 | HR 68 | Ht 63.5 in | Wt 142.0 lb

## 2019-12-09 DIAGNOSIS — E7801 Familial hypercholesterolemia: Secondary | ICD-10-CM

## 2019-12-09 MED ORDER — ROSUVASTATIN CALCIUM 40 MG PO TABS: 40.0000 mg | ORAL_TABLET | Freq: Every day | ORAL | 3 refills | Status: DC

## 2019-12-09 NOTE — Progress Notes (Signed)
Cardiology Office Note:    Date:  12/09/2019   ID:  Elizabeth Garrison, DOB [birth year], MRN [id number]  PCP:  Trisha Mangle, FNP  [facility] Cardiologist:  No primary care provider on file.  [facility] Electrophysiologist:  None   CC: hyperlipidemia Consulted for the evaluation of primary prevention at the behest of Trisha Mangle, FNP  History of Present Illness:    Elizabeth Garrison is a 34 y.o. female with a hx of OCD and Depression, Asthma, prior COVID infectionwho presents for primary prevention visit.  Patient notes that (s)he is feeling well.  Has had no chest pain, chest pressure, chest tightness, chest stinging. Notes that she feels an occasional missed beat which makes her cough that occurs .  Patient exerts with exercise and feels no symptoms. Notes nocturnal heart rates in the 40s without symptoms.  Low heart rates that rise with activity.  No shortness of breath, DOE.  No PND or orthopnea.  No bendopnea.  No syncope or near syncope.  Patient reports prior cardiac testing including  echo,  stress test, heart catheterizations,  cardioversion,  ablations.   Past Medical History:  Diagnosis Date  . Anxiety    as a teen  . Asthma 02/08/11  . H/O cystitis   . H/O pyelonephritis    as adolescent  . H/O varicella   . History of kidney stones    age 29  . Yeast infection     Past Surgical History:  Procedure Laterality Date  . CESAREAN SECTION N/A 10/18/2016   Procedure: Primary CESAREAN SECTION;  Surgeon: Shea Evans, MD;  Location: [facility] BIRTHING SUIT[facility];  Service: Obstetrics;  Laterality: N/A;  EDD: 10/25/16  . TONSILLECTOMY     age 40  . WISDOM TOOTH EXTRACTION  age 23    Current Medications: No outpatient medications have been marked as taking for the 12/09/19 encounter (Office Visit) with Christell Constant, MD.     Allergies:   Patient has no known allergies.   Social History   Socioeconomic History  . Marital status: Married     Spouse name: Not on file  . Number of children: Not on file  . Years of education: Not on file  . Highest education level: Not on file  Occupational History  . Not on file  Tobacco Use  . Smoking status: Never Smoker  . Smokeless tobacco: Never Used  Vaping Use  . Vaping Use: Never used  Substance and Sexual Activity  . Alcohol use: No  . Drug use: No  . Sexual activity: Yes    Birth control/protection: None  Other Topics Concern  . Not on file  Social History Narrative  . Not on file   Social Determinants of Health   Financial Resource Strain:   . Difficulty of Paying Living Expenses: Not on file  Food Insecurity:   . Worried About Programme researcher, broadcasting/film/video in the Last Year: Not on file  . Ran Out of Food in the Last Year: Not on file  Transportation Needs:   . Lack of Transportation (Medical): Not on file  . Lack of Transportation (Non-Medical): Not on file  Physical Activity:   . Days of Exercise per Week: Not on file  . Minutes of Exercise per Session: Not on file  Stress:   . Feeling of Stress : Not on file  Social Connections:   . Frequency of Communication with Friends and Family: Not on file  . Frequency of Social Gatherings  with Friends and Family: Not on file  . Attends Religious Services: Not on file  . Active Member of Clubs or Organizations: Not on file  . Attends Banker Meetings: Not on file  . Marital Status: Not on file    Family History: The patient's family history includes Asthma in her brother and mother; Cancer in her maternal grandfather and paternal grandmother; Hypertension in her maternal grandmother, maternal uncle, and mother; Other in her mother; Thyroid disease in her maternal uncle and mother. Prior Pacemakers in the past.; has high cholesterol.   ROS:   Please see the history of present illness.    All other systems reviewed and are negative.  EKGs/Labs/Other Studies Reviewed:    The following studies were reviewed  today:  EKG:  EKG is ordered today.  The ekg ordered today demonstrates SR rate 68 without ST/T changes.  Recent Labs: No results found for requested labs within last 8760 hours.  Recent Lipid Panel No results found for: CHOL, TRIG, HDL, CHOLHDL, VLDL, LDLCALC, LDLDIRECT  [facility] Labs 10/15/19: LDL 213 Cholesterol 288 TG 92 HDL 61 AST/ALT 18/15  Risk Assessment/Calculations:     N/A  Physical Exam:    VS:  BP 124/84   Pulse 68   Ht 5' 3.5" (1.613 m)   Wt 142 lb (64.4 kg)   SpO2 97%   BMI 24.76 kg/m     Wt Readings from Last 3 Encounters:  12/09/19 142 lb (64.4 kg)  10/18/16 165 lb (74.8 kg)  10/09/16 163 lb (73.9 kg)    GEN:  Well nourished, well developed in no acute distress HEENT: Normal NECK: No JVD; No carotid bruits LYMPHATICS: No lymphadenopathy CARDIAC: RRR, no murmurs, rubs, gallops RESPIRATORY:  Clear to auscultation without rales, wheezing or rhonchi  ABDOMEN: Soft, non-tender, non-distended MUSCULOSKELETAL:  No edema; No deformity, no tendon xanthoma SKIN: Warm and dry NEUROLOGIC:  Alert and oriented x 3 PSYCHIATRIC:  Normal affect   ASSESSMENT:    1. Familial hypercholesterolemia    PLAN:    In order of problems listed above:  Hyperlipidemia -LDL goal less than 100 - LDL above 190 - Meets indication for high intensity statin - low threshold to send to lipid clinic for additional patient support -Recheck lipid profile LFTs in three months - nutrition consult - gave education on dietary changes at length   3 month follow up unless new symptoms or abnormal test results warranting change in plan  Would be reasonable for  APP Follow up   Shared Decision Making/Informed Consent      Patient amenable to plan  Medication Adjustments/Labs and Tests Ordered: Current medicines are reviewed at length with the patient today.  Concerns regarding medicines are outlined above.  Orders Placed This Encounter  Procedures  . Lipid panel  . Hepatic  function panel  . EKG 12-Lead   Meds ordered this encounter  Medications  . rosuvastatin (CRESTOR) 40 MG tablet    Sig: Take 1 tablet (40 mg total) by mouth daily.    Dispense:  90 tablet    Refill:  3    Patient Instructions  Medication Instructions:  Your physician has recommended you make the following change in your medication:   START: rosuvastatin (crestor) 40 mg tablet: Take 1 tablet by mouth once a day  *If you need a refill on your cardiac medications before your next appointment, please call your pharmacy*   Lab Work: Your physician recommends that you return for a FASTING lipid  profile and liver function panel in 3 months   If you have labs (blood work) drawn today and your tests are completely normal, you will receive your results only by: Marland Kitchen MyChart Message (if you have MyChart) OR . A paper copy in the mail If you have any lab test that is abnormal or we need to change your treatment, we will call you to review the results.   Testing/Procedures: None   Follow-Up: At [facility], you and your health needs are our priority.  As part of our continuing mission to provide you with exceptional heart care, we have created designated Provider Care Teams.  These Care Teams include your primary Cardiologist (physician) and Advanced Practice Providers (APPs -  Physician Assistants and Nurse Practitioners) who all work together to provide you with the care you need, when you need it.  We recommend signing up for the patient portal called "MyChart".  Sign up information is provided on this After Visit Summary.  MyChart is used to connect with patients for Virtual Visits (Telemedicine).  Patients are able to view lab/test results, encounter notes, upcoming appointments, etc.  Non-urgent messages can be sent to your provider as well.   To learn more about what you can do with MyChart, go to [email or web address].    Your next appointment:   3 month(s)  The format for  your next appointment:   In Person  Provider:   Riley Lam, MD   Other Instructions None     Signed, Christell Constant, MD  12/09/2019 9:54 AM    [facility]

## 2019-12-09 NOTE — Patient Instructions (Signed)
Medication Instructions:  Your physician has recommended you make the following change in your medication:   START: rosuvastatin (crestor) 40 mg tablet: Take 1 tablet by mouth once a day  *If you need a refill on your cardiac medications before your next appointment, please call your pharmacy*   Lab Work: Your physician recommends that you return for a FASTING lipid profile and liver function panel in 3 months   If you have labs (blood work) drawn today and your tests are completely normal, you will receive your results only by: Marland Kitchen MyChart Message (if you have MyChart) OR . A paper copy in the mail If you have any lab test that is abnormal or we need to change your treatment, we will call you to review the results.   Testing/Procedures: None   Follow-Up: At [facility], you and your health needs are our priority.  As part of our continuing mission to provide you with exceptional heart care, we have created designated Provider Care Teams.  These Care Teams include your primary Cardiologist (physician) and Advanced Practice Providers (APPs -  Physician Assistants and Nurse Practitioners) who all work together to provide you with the care you need, when you need it.  We recommend signing up for the patient portal called "MyChart".  Sign up information is provided on this After Visit Summary.  MyChart is used to connect with patients for Virtual Visits (Telemedicine).  Patients are able to view lab/test results, encounter notes, upcoming appointments, etc.  Non-urgent messages can be sent to your provider as well.   To learn more about what you can do with MyChart, go to [email or web address].    Your next appointment:   3 month(s)  The format for your next appointment:   In Person  Provider:   Riley Lam, MD   Other Instructions None

## 2020-02-29 DIAGNOSIS — M542 Cervicalgia: Secondary | ICD-10-CM | POA: Diagnosis not present

## 2020-02-29 DIAGNOSIS — M5412 Radiculopathy, cervical region: Secondary | ICD-10-CM | POA: Diagnosis not present

## 2020-03-01 DIAGNOSIS — M542 Cervicalgia: Secondary | ICD-10-CM | POA: Diagnosis not present

## 2020-03-07 DIAGNOSIS — M542 Cervicalgia: Secondary | ICD-10-CM | POA: Diagnosis not present

## 2020-03-08 ENCOUNTER — Other Ambulatory Visit: Payer: BC Managed Care – PPO

## 2020-03-08 DIAGNOSIS — M542 Cervicalgia: Secondary | ICD-10-CM | POA: Diagnosis not present

## 2020-03-10 DIAGNOSIS — M542 Cervicalgia: Secondary | ICD-10-CM | POA: Diagnosis not present

## 2020-03-15 ENCOUNTER — Ambulatory Visit: Payer: BC Managed Care – PPO | Admitting: Internal Medicine

## 2020-03-17 DIAGNOSIS — M542 Cervicalgia: Secondary | ICD-10-CM | POA: Diagnosis not present

## 2020-03-18 ENCOUNTER — Other Ambulatory Visit: Payer: Self-pay | Admitting: Psychiatry

## 2020-03-18 DIAGNOSIS — F3341 Major depressive disorder, recurrent, in partial remission: Secondary | ICD-10-CM

## 2020-03-18 DIAGNOSIS — F410 Panic disorder [episodic paroxysmal anxiety] without agoraphobia: Secondary | ICD-10-CM

## 2020-03-18 DIAGNOSIS — F422 Mixed obsessional thoughts and acts: Secondary | ICD-10-CM

## 2020-03-21 ENCOUNTER — Telehealth: Payer: Self-pay | Admitting: Adult Health

## 2020-03-21 NOTE — Telephone Encounter (Signed)
Patient called in today requesting refill for Zoloft 100mg . She is a former pt of Dr . She has appt 4/13. Pharmacy is [facility] on [address] [place], [place]

## 2020-03-21 NOTE — Telephone Encounter (Signed)
She hasn't been in over a year and 2 months, so she will need to wait until appointment.

## 2020-03-29 ENCOUNTER — Other Ambulatory Visit: Payer: Self-pay | Admitting: Adult Health

## 2020-03-29 DIAGNOSIS — F3341 Major depressive disorder, recurrent, in partial remission: Secondary | ICD-10-CM

## 2020-03-29 DIAGNOSIS — F422 Mixed obsessional thoughts and acts: Secondary | ICD-10-CM

## 2020-03-29 DIAGNOSIS — F410 Panic disorder [episodic paroxysmal anxiety] without agoraphobia: Secondary | ICD-10-CM

## 2020-03-29 DIAGNOSIS — M542 Cervicalgia: Secondary | ICD-10-CM | POA: Diagnosis not present

## 2020-03-29 MED ORDER — SERTRALINE HCL 100 MG PO TABS: 150.0000 mg | ORAL_TABLET | Freq: Every day | ORAL | 3 refills | Status: DC

## 2020-03-29 NOTE — Telephone Encounter (Signed)
Called patient. Script sent.

## 2020-03-29 NOTE — Telephone Encounter (Signed)
Elizabeth Garrison back into the office today. States that she needs her medication for Zoloft 100mg  as she has been taking it for ten years and that without the medication she doesn't fell herself. I explained to her that she wouldn't be able to get prescription filled until after her appt 4/13. She went on to say that she was only seeing Dr 5/13 once a year and that is why she hasn't been seen since last year. Pls CB with further information regarding this matter. [phone]

## 2020-03-29 NOTE — Telephone Encounter (Signed)
Please review ebonee's most recent message

## 2020-04-01 DIAGNOSIS — M25511 Pain in right shoulder: Secondary | ICD-10-CM | POA: Diagnosis not present

## 2020-04-01 DIAGNOSIS — M25521 Pain in right elbow: Secondary | ICD-10-CM | POA: Diagnosis not present

## 2020-04-20 ENCOUNTER — Ambulatory Visit (INDEPENDENT_AMBULATORY_CARE_PROVIDER_SITE_OTHER): Payer: BC Managed Care – PPO | Admitting: Adult Health

## 2020-04-20 ENCOUNTER — Other Ambulatory Visit: Payer: Self-pay

## 2020-04-20 ENCOUNTER — Encounter: Payer: Self-pay | Admitting: Adult Health

## 2020-04-20 DIAGNOSIS — F422 Mixed obsessional thoughts and acts: Secondary | ICD-10-CM

## 2020-04-20 DIAGNOSIS — F3341 Major depressive disorder, recurrent, in partial remission: Secondary | ICD-10-CM

## 2020-04-20 DIAGNOSIS — F410 Panic disorder [episodic paroxysmal anxiety] without agoraphobia: Secondary | ICD-10-CM | POA: Diagnosis not present

## 2020-04-20 DIAGNOSIS — F411 Generalized anxiety disorder: Secondary | ICD-10-CM

## 2020-04-20 MED ORDER — ALPRAZOLAM 0.5 MG PO TABS: 0.5000 mg | ORAL_TABLET | Freq: Two times a day (BID) | ORAL | 0 refills | Status: DC | PRN

## 2020-04-20 MED ORDER — SERTRALINE HCL 100 MG PO TABS: 150.0000 mg | ORAL_TABLET | Freq: Every day | ORAL | 3 refills | Status: DC

## 2020-04-20 NOTE — Progress Notes (Signed)
Elizabeth Garrison [id number] [birth year] 34 y.o.  Subjective:   Patient ID:  Elizabeth Garrison is a 35 y.o. (DOB 13-Oct-1985) female.  Chief Complaint: No chief complaint on file.   HPI Elizabeth Garrison presents to the office today for follow-up of OCD and GAD.  Describes mood today as "ok". Pleasant. Mood symptoms - denies depression. Gets irritable when overwhelmed. Feels anxious at times. Stating "I'm doing fine". Feels like Zoloft 150mg  continues to work well for her. Stable interest and motivation. Taking medications as prescribed.  Energy levels stable. Active, has a regular exercise routine.  Enjoys some usual interests and activities. Married. Lives with husband and 3 children. Spending time with family 3 children 3, 6, and 8. Appetite adequate. Weight gain lately - unable to exercise due to an injury. Sleeps well most nights. Averages 6 to 8 hours. Focus and concentration stable. Completing tasks. Managing aspects of household. Works full time. Denies SI or HI.  Denies AH or VH.  Previous medication trials: Prozac  Review of Systems:  Review of Systems  Musculoskeletal: Negative for gait problem.  Neurological: Negative for tremors.  Psychiatric/Behavioral:       Please refer to HPI    Medications: I have reviewed the patient's current medications.  Current Outpatient Medications  Medication Sig Dispense Refill  . ALPRAZolam (XANAX) 0.5 MG tablet Take 1 tablet (0.5 mg total) by mouth 2 (two) times daily as needed for anxiety (Panic attack). 180 tablet 0  . rosuvastatin (CRESTOR) 40 MG tablet Take 1 tablet (40 mg total) by mouth daily. 90 tablet 3  . sertraline (ZOLOFT) 100 MG tablet Take 1.5 tablets (150 mg total) by mouth at bedtime. 135 tablet 3   No current facility-administered medications for this visit.    Medication Side Effects: None  Allergies: No Known Allergies  Past Medical History:  Diagnosis Date  . Anxiety    as a teen  . Asthma 02/08/11  . H/O  cystitis   . H/O pyelonephritis    as adolescent  . H/O varicella   . History of kidney stones    age 30  . Yeast infection     Family History  Problem Relation Age of Onset  . Hypertension Mother   . Asthma Mother   . Thyroid disease Mother   . Other Mother        hypotension  . Hypertension Maternal Grandmother   . Cancer Maternal Grandfather        lymphoma  . Cancer Paternal Grandmother        liver  . Asthma Brother   . Hypertension Maternal Uncle   . Thyroid disease Maternal Uncle     Social History   Socioeconomic History  . Marital status: Married    Spouse name: Not on file  . Number of children: Not on file  . Years of education: Not on file  . Highest education level: Not on file  Occupational History  . Not on file  Tobacco Use  . Smoking status: Never Smoker  . Smokeless tobacco: Never Used  Vaping Use  . Vaping Use: Never used  Substance and Sexual Activity  . Alcohol use: No  . Drug use: No  . Sexual activity: Yes    Birth control/protection: None  Other Topics Concern  . Not on file  Social History Narrative  . Not on file   Social Determinants of Health   Financial Resource Strain: Not on file  Food Insecurity: Not on file  Transportation Needs: Not on file  Physical Activity: Not on file  Stress: Not on file  Social Connections: Not on file  Intimate Partner Violence: Not on file    Past Medical History, Surgical history, Social history, and Family history were reviewed and updated as appropriate.   Please see review of systems for further details on the patient's review from today.   Objective:   Physical Exam:  There were no vitals taken for this visit.  Physical Exam Constitutional:      General: She is not in acute distress. Musculoskeletal:        General: No deformity.  Neurological:     Mental Status: She is alert and oriented to person, place, and time.     Coordination: Coordination normal.  Psychiatric:         Attention and Perception: Attention and perception normal. She does not perceive auditory or visual hallucinations.        Mood and Affect: Mood normal. Mood is not anxious or depressed. Affect is not labile, blunt, angry or inappropriate.        Speech: Speech normal.        Behavior: Behavior normal.        Thought Content: Thought content normal. Thought content is not paranoid or delusional. Thought content does not include homicidal or suicidal ideation. Thought content does not include homicidal or suicidal plan.        Cognition and Memory: Cognition and memory normal.        Judgment: Judgment normal.     Comments: Insight intact     Lab Review:     Component Value Date/Time   NA 134 (L) 10/12/2016 1156   K 3.5 10/12/2016 1156   CL 106 10/12/2016 1156   CO2 21 (L) 10/12/2016 1156   GLUCOSE 99 10/12/2016 1156   BUN 12 10/12/2016 1156   CREATININE 0.46 10/12/2016 1156   CALCIUM 8.4 (L) 10/12/2016 1156   PROT 6.5 10/12/2016 1156   ALBUMIN 2.9 (L) 10/12/2016 1156   AST 21 10/12/2016 1156   ALT 14 10/12/2016 1156   ALKPHOS 148 (H) 10/12/2016 1156   BILITOT 0.7 10/12/2016 1156   GFRNONAA >60 10/12/2016 1156   GFRAA >60 10/12/2016 1156       Component Value Date/Time   WBC 8.9 10/19/2016 0538   RBC 3.61 (L) 10/19/2016 0538   HGB 10.7 (L) 10/19/2016 0538   HCT 31.6 (L) 10/19/2016 0538   PLT 150 10/19/2016 0538   MCV 87.5 10/19/2016 0538   MCH 29.6 10/19/2016 0538   MCHC 33.9 10/19/2016 0538   RDW 13.8 10/19/2016 0538   LYMPHSABS 0.8 12/26/2006 1215   MONOABS 1.2 (H) 12/26/2006 1215   EOSABS 0.0 (L) 12/26/2006 1215   BASOSABS 0.1 12/26/2006 1215    No results found for: POCLITH, LITHIUM   No results found for: PHENYTOIN, PHENOBARB, VALPROATE, CBMZ   .res Assessment: Plan:    Plan:  PDMP reviewed  1. Zoloft 150mg  daily  Read and reviewed note with patient for accuracy.   RTC 4 weeks  Patient advised to contact office with any questions, adverse effects,  or acute worsening in signs and symptoms.  Diagnoses and all orders for this visit:  Mixed obsessional thoughts and acts -     sertraline (ZOLOFT) 100 MG tablet; Take 1.5 tablets (150 mg total) by mouth at bedtime.  Generalized anxiety disorder  Panic disorder -     sertraline (ZOLOFT) 100 MG tablet; Take 1.5 tablets (  150 mg total) by mouth at bedtime. -     ALPRAZolam (XANAX) 0.5 MG tablet; Take 1 tablet (0.5 mg total) by mouth 2 (two) times daily as needed for anxiety (Panic attack).  Depression, major, recurrent, in partial remission (HCC) -     sertraline (ZOLOFT) 100 MG tablet; Take 1.5 tablets (150 mg total) by mouth at bedtime.     Please see After Visit Summary for patient specific instructions.  No future appointments.  No orders of the defined types were placed in this encounter.   -------------------------------

## 2020-05-23 DIAGNOSIS — M7989 Other specified soft tissue disorders: Secondary | ICD-10-CM | POA: Diagnosis not present

## 2020-05-25 DIAGNOSIS — Z807 Family history of other malignant neoplasms of lymphoid, hematopoietic and related tissues: Secondary | ICD-10-CM | POA: Diagnosis not present

## 2020-05-25 DIAGNOSIS — M79621 Pain in right upper arm: Secondary | ICD-10-CM | POA: Diagnosis not present

## 2020-05-25 DIAGNOSIS — R2231 Localized swelling, mass and lump, right upper limb: Secondary | ICD-10-CM | POA: Diagnosis not present

## 2020-08-16 DIAGNOSIS — M9901 Segmental and somatic dysfunction of cervical region: Secondary | ICD-10-CM | POA: Diagnosis not present

## 2020-08-16 DIAGNOSIS — M9908 Segmental and somatic dysfunction of rib cage: Secondary | ICD-10-CM | POA: Diagnosis not present

## 2020-08-16 DIAGNOSIS — M9902 Segmental and somatic dysfunction of thoracic region: Secondary | ICD-10-CM | POA: Diagnosis not present

## 2020-08-16 DIAGNOSIS — M5386 Other specified dorsopathies, lumbar region: Secondary | ICD-10-CM | POA: Diagnosis not present

## 2020-08-17 DIAGNOSIS — M9901 Segmental and somatic dysfunction of cervical region: Secondary | ICD-10-CM | POA: Diagnosis not present

## 2020-08-17 DIAGNOSIS — M9902 Segmental and somatic dysfunction of thoracic region: Secondary | ICD-10-CM | POA: Diagnosis not present

## 2020-08-17 DIAGNOSIS — M5386 Other specified dorsopathies, lumbar region: Secondary | ICD-10-CM | POA: Diagnosis not present

## 2020-08-17 DIAGNOSIS — M9908 Segmental and somatic dysfunction of rib cage: Secondary | ICD-10-CM | POA: Diagnosis not present

## 2020-08-24 DIAGNOSIS — M9902 Segmental and somatic dysfunction of thoracic region: Secondary | ICD-10-CM | POA: Diagnosis not present

## 2020-08-24 DIAGNOSIS — M5386 Other specified dorsopathies, lumbar region: Secondary | ICD-10-CM | POA: Diagnosis not present

## 2020-08-24 DIAGNOSIS — M9908 Segmental and somatic dysfunction of rib cage: Secondary | ICD-10-CM | POA: Diagnosis not present

## 2020-08-24 DIAGNOSIS — M9901 Segmental and somatic dysfunction of cervical region: Secondary | ICD-10-CM | POA: Diagnosis not present

## 2020-08-29 DIAGNOSIS — L0201 Cutaneous abscess of face: Secondary | ICD-10-CM | POA: Diagnosis not present

## 2020-08-29 DIAGNOSIS — L0889 Other specified local infections of the skin and subcutaneous tissue: Secondary | ICD-10-CM | POA: Diagnosis not present

## 2020-08-31 ENCOUNTER — Emergency Department (HOSPITAL_BASED_OUTPATIENT_CLINIC_OR_DEPARTMENT_OTHER)
Admission: EM | Admit: 2020-08-31 | Discharge: 2020-08-31 | Disposition: A | Discharge status: Home / Self Care | Payer: BC Managed Care – PPO | Attending: Emergency Medicine | Admitting: Emergency Medicine

## 2020-08-31 ENCOUNTER — Other Ambulatory Visit: Payer: Self-pay

## 2020-08-31 ENCOUNTER — Encounter (HOSPITAL_BASED_OUTPATIENT_CLINIC_OR_DEPARTMENT_OTHER): Payer: Self-pay | Admitting: Emergency Medicine

## 2020-08-31 DIAGNOSIS — Z79899 Other long term (current) drug therapy: Secondary | ICD-10-CM | POA: Diagnosis not present

## 2020-08-31 DIAGNOSIS — H04002 Unspecified dacryoadenitis, left lacrimal gland: Secondary | ICD-10-CM

## 2020-08-31 DIAGNOSIS — L03211 Cellulitis of face: Secondary | ICD-10-CM | POA: Diagnosis not present

## 2020-08-31 DIAGNOSIS — L03213 Periorbital cellulitis: Secondary | ICD-10-CM | POA: Diagnosis not present

## 2020-08-31 DIAGNOSIS — H04302 Unspecified dacryocystitis of left lacrimal passage: Secondary | ICD-10-CM | POA: Insufficient documentation

## 2020-08-31 DIAGNOSIS — H02841 Edema of right upper eyelid: Secondary | ICD-10-CM | POA: Diagnosis not present

## 2020-08-31 DIAGNOSIS — H04309 Unspecified dacryocystitis of unspecified lacrimal passage: Secondary | ICD-10-CM | POA: Diagnosis not present

## 2020-08-31 DIAGNOSIS — J45909 Unspecified asthma, uncomplicated: Secondary | ICD-10-CM | POA: Diagnosis not present

## 2020-08-31 DIAGNOSIS — R220 Localized swelling, mass and lump, head: Secondary | ICD-10-CM | POA: Diagnosis not present

## 2020-08-31 NOTE — Discharge Instructions (Addendum)
1.  Go to Dr. Wende Mott office for a 2 PM appointment.  Arrive 20 minutes early for paperwork and intake. 2.  You may have an abscess or infection in your lacrimal gland.  This is an important part of the function of your eye tearing.  Is important that he get appropriately assessed for further treatment. 3.  Continue your doxycycline as prescribed until further instructions from Dr. Alben Spittle

## 2020-08-31 NOTE — ED Provider Notes (Signed)
[facility] EMERGENCY DEPT Provider Note   CSN: [id number] Arrival date & time: 08/31/20  0957     History Chief Complaint  Patient presents with   Eyelid Pain    Elizabeth Garrison is a 35 y.o. female.  HPI Patient where she has swelling and redness in the brow on the right.  It started 4 days ago and was mild.  She was seen by her dermatologist 2 days ago and a specimen was obtained by a needle aspiration.  She reports she does not have the results from that yet.  She reports however it has gotten much more swollen and painful and now pain is radiating toward the medial aspect of her eye towards her nose.  She has not had any drainage or discharge.  She reports that the swelling has worsened significantly.  She is taking doxycycline and has had 4 doses thus far.  No problems with the vision.    Past Medical History:  Diagnosis Date   Anxiety    as a teen   Asthma 02/08/11   H/O cystitis    H/O pyelonephritis    as adolescent   H/O varicella    History of kidney stones    age 85   Yeast infection     Patient Active Problem List   Diagnosis Date Noted   Familial hypercholesterolemia 12/09/2019   Panic disorder 02/17/2018   Depression, major, recurrent, in partial remission (HCC) 02/17/2018   Cesarean delivery delivered: Indication: history of shoulder dystocia x 2; LGA 10/18/2016   Postpartum care following cesarean delivery (10/11) 10/18/2016   Asthma 02/08/2011   OCD (obsessive compulsive disorder) 02/08/2011   Urolithiasis 02/08/2011    Past Surgical History:  Procedure Laterality Date   CESAREAN SECTION N/A 10/18/2016   Procedure: Primary CESAREAN SECTION;  Surgeon: Shea Evans, MD;  Location: [facility] BIRTHING SUITES;  Service: Obstetrics;  Laterality: N/A;  EDD: 10/25/16   TONSILLECTOMY     age 56   WISDOM TOOTH EXTRACTION  age 89     OB History     Gravida  3   Para  3   Term  2   Preterm  0   AB  0   Living  3      SAB  0   IAB   0   Ectopic  0   Multiple  0   Live Births  3           Family History  Problem Relation Age of Onset   Hypertension Mother    Asthma Mother    Thyroid disease Mother    Other Mother        hypotension   Hypertension Maternal Grandmother    Cancer Maternal Grandfather        lymphoma   Cancer Paternal Grandmother        liver   Asthma Brother    Hypertension Maternal Uncle    Thyroid disease Maternal Uncle     Social History   Tobacco Use   Smoking status: Never   Smokeless tobacco: Never  Vaping Use   Vaping Use: Never used  Substance Use Topics   Alcohol use: No   Drug use: No    Home Medications Prior to Admission medications   Medication Sig Start Date End Date Taking? Authorizing Provider  ALPRAZolam Prudy Feeler) 0.5 MG tablet Take 1 tablet (0.5 mg total) by mouth 2 (two) times daily as needed for anxiety (Panic attack). 04/20/20  Yes Mozingo,  Thereasa Solo, NP  doxycycline (VIBRA-TABS) 100 MG tablet Take 100 mg by mouth 2 (two) times daily.   Yes [provider]  rosuvastatin (CRESTOR) 40 MG tablet Take 1 tablet (40 mg total) by mouth daily. 12/09/19  Yes Chandrasekhar, Mahesh A, MD  sertraline (ZOLOFT) 100 MG tablet Take 1.5 tablets (150 mg total) by mouth at bedtime. 04/20/20  Yes Mozingo, Thereasa Solo, NP    Allergies    Patient has no known allergies.  Review of Systems   Review of Systems Constitutional: No fever no chills no malaise Neurologic: No headache no confusion Physical Exam Updated Vital Signs BP (!) 139/99 (BP Location: Right Arm)   Pulse 63   Temp 98.4 F (36.9 C) (Oral)   Resp 16   Ht 5\' 3"  (1.6 m)   Wt 63.5 kg   SpO2 100%   BMI 24.80 kg/m   Physical Exam Constitutional:      Appearance: Normal appearance.  HENT:     Mouth/Throat:     Pharynx: Oropharynx is clear.  Eyes:     Comments: Pupils equally round reactive to light.  Extraocular motions are normal.  No injection of the sclera.  BB sized firm nodule  within the pictured area of erythema and appearance of cellulitis.  Pulmonary:     Effort: Pulmonary effort is normal.  Skin:    General: Skin is dry.  Neurological:     General: No focal deficit present.     Mental Status: She is alert and oriented to person, place, and time.        ED Results / Procedures / Treatments   Labs (all labs ordered are listed, but only abnormal results are displayed) Labs Reviewed - No data to display  EKG None  Radiology No results found.  Procedures Procedures   Medications Ordered in ED Medications - No data to display  ED Course  I have reviewed the triage vital signs and the nursing notes.  Pertinent labs & imaging results that were available during my care of the patient were reviewed by me and considered in my medical decision making (see chart for details).    MDM Rules/Calculators/A&P                           Consult: Reviewed with Dr. of ophthalmology.  Agrees that at this time attempted aspiration or incision could add negative risk benefit with potential involvement of lacrimal gland.  Dr. Alben Spittle will evaluate patient in the office to determine if immediate treatment can be performed by him or if patient requires referral to oculoplastics for potential lacrimal gland involvement  Patient clinically well.  She has had worsening swelling in the brow with 2 days of doxycycline and aspiration 2 days ago.  Patient is referred to ophthalmology for further recommendations of management. Final Clinical Impression(s) / ED Diagnoses Final diagnoses:  Lacrimal gland inflammation, left  Cellulitis of face    Rx / DC Orders ED Discharge Orders     None        Alben Spittle, MD 08/31/20 1108

## 2020-08-31 NOTE — ED Triage Notes (Signed)
Pt arrives to ED with c/o of right eyelid abscess x4 days. Was started on doxycycline by dermatology but abscess has became more painful and swollen.

## 2020-09-01 ENCOUNTER — Other Ambulatory Visit: Payer: Self-pay

## 2020-09-01 ENCOUNTER — Encounter (HOSPITAL_BASED_OUTPATIENT_CLINIC_OR_DEPARTMENT_OTHER): Payer: Self-pay | Admitting: Emergency Medicine

## 2020-09-01 ENCOUNTER — Emergency Department (HOSPITAL_BASED_OUTPATIENT_CLINIC_OR_DEPARTMENT_OTHER): Payer: BC Managed Care – PPO

## 2020-09-01 ENCOUNTER — Emergency Department (HOSPITAL_BASED_OUTPATIENT_CLINIC_OR_DEPARTMENT_OTHER)
Admission: EM | Admit: 2020-09-01 | Discharge: 2020-09-01 | Disposition: A | Discharge status: Home / Self Care | Payer: BC Managed Care – PPO | Attending: Emergency Medicine | Admitting: Emergency Medicine

## 2020-09-01 DIAGNOSIS — J45909 Unspecified asthma, uncomplicated: Secondary | ICD-10-CM | POA: Diagnosis not present

## 2020-09-01 DIAGNOSIS — H02841 Edema of right upper eyelid: Secondary | ICD-10-CM | POA: Diagnosis not present

## 2020-09-01 DIAGNOSIS — Z79899 Other long term (current) drug therapy: Secondary | ICD-10-CM | POA: Diagnosis not present

## 2020-09-01 DIAGNOSIS — L03213 Periorbital cellulitis: Secondary | ICD-10-CM | POA: Diagnosis not present

## 2020-09-01 DIAGNOSIS — N9489 Other specified conditions associated with female genital organs and menstrual cycle: Secondary | ICD-10-CM | POA: Insufficient documentation

## 2020-09-01 DIAGNOSIS — H02843 Edema of right eye, unspecified eyelid: Secondary | ICD-10-CM | POA: Diagnosis not present

## 2020-09-01 LAB — HCG, SERUM, QUALITATIVE: Preg, Serum: NEGATIVE

## 2020-09-01 LAB — CBC WITH DIFFERENTIAL/PLATELET
Abs Immature Granulocytes: 0.01 10*3/uL (ref 0.00–0.07)
Basophils Absolute: 0 10*3/uL (ref 0.0–0.1)
Basophils Relative: 1 %
Eosinophils Absolute: 0.1 10*3/uL (ref 0.0–0.5)
Eosinophils Relative: 2 %
HCT: 39.1 % (ref 36.0–46.0)
Hemoglobin: 13.4 g/dL (ref 12.0–15.0)
Immature Granulocytes: 0 %
Lymphocytes Relative: 31 %
Lymphs Abs: 2 10*3/uL (ref 0.7–4.0)
MCH: 30.2 pg (ref 26.0–34.0)
MCHC: 34.3 g/dL (ref 30.0–36.0)
MCV: 88.3 fL (ref 80.0–100.0)
Monocytes Absolute: 0.5 10*3/uL (ref 0.1–1.0)
Monocytes Relative: 7 %
Neutro Abs: 3.8 10*3/uL (ref 1.7–7.7)
Neutrophils Relative %: 59 %
Platelets: 184 10*3/uL (ref 150–400)
RBC: 4.43 MIL/uL (ref 3.87–5.11)
RDW: 11.9 % (ref 11.5–15.5)
WBC: 6.4 10*3/uL (ref 4.0–10.5)
nRBC: 0 % (ref 0.0–0.2)

## 2020-09-01 LAB — BASIC METABOLIC PANEL
Anion gap: 10 (ref 5–15)
BUN: 19 mg/dL (ref 6–20)
CO2: 25 mmol/L (ref 22–32)
Calcium: 9.3 mg/dL (ref 8.9–10.3)
Chloride: 103 mmol/L (ref 98–111)
Creatinine, Ser: 0.63 mg/dL (ref 0.44–1.00)
GFR, Estimated: >60 mL/min (ref >60)
Glucose, Bld: 92 mg/dL (ref 70–99)
Potassium: 3.7 mmol/L (ref 3.5–5.1)
Sodium: 138 mmol/L (ref 135–145)

## 2020-09-01 MED ORDER — CLINDAMYCIN PHOSPHATE 600 MG/50ML IV SOLN
600.0000 mg | Freq: Once | INTRAVENOUS | Status: AC
  Administered 2020-09-01: 600 mg via INTRAVENOUS
  Filled 2020-09-01: qty 50

## 2020-09-01 MED ORDER — IOHEXOL 350 MG/ML SOLN
75.0000 mL | Freq: Once | INTRAVENOUS | Status: AC | PRN
  Administered 2020-09-01: 75 mL via INTRAVENOUS

## 2020-09-01 MED ORDER — DOXYCYCLINE HYCLATE 100 MG PO TABS: 100.0000 mg | ORAL_TABLET | Freq: Two times a day (BID) | ORAL | 0 refills | Status: DC

## 2020-09-01 MED ORDER — CLINDAMYCIN HCL 300 MG PO CAPS: 600.0000 mg | ORAL_CAPSULE | Freq: Four times a day (QID) | ORAL | 0 refills | Status: AC

## 2020-09-01 NOTE — ED Provider Notes (Signed)
[facility] EMERGENCY DEPT Provider Note   CSN: [id number] Arrival date & time: 09/01/20  1500     History Chief Complaint  Patient presents with   Eye Problem    Elizabeth Garrison is a 35 y.o. female.  Patient is a 35 yo female presenting for eye pain. Patient states she spontaneously developed swelling to the right eyelid several days ago. States she followed with her dermatologist who did a bx that came back for MRSA. States they have difficulty recommending her next steps. Patient also went to opthamologist who stated they do not treat soft tissue infections. Currently patient admits to swelling, redness, and pain of left eyelid. Denies open wounds/drainage. Patient states she now has right eye pain when she moves it. Denies vision changes. Denies fevers, chills, nausea, vomiting, myalgias. Pt has not been started on antibiotics yet.   The history is provided by the patient. No language interpreter was used.  Eye Problem Location:  Right eye Quality:  Aching Severity:  Moderate Onset quality:  Gradual Progression:  Worsening Associated symptoms: no discharge, no itching, no photophobia and no vomiting       Past Medical History:  Diagnosis Date   Anxiety    as a teen   Asthma 02/08/11   H/O cystitis    H/O pyelonephritis    as adolescent   H/O varicella    History of kidney stones    age 85   Yeast infection     Patient Active Problem List   Diagnosis Date Noted   Familial hypercholesterolemia 12/09/2019   Panic disorder 02/17/2018   Depression, major, recurrent, in partial remission (HCC) 02/17/2018   Cesarean delivery delivered: Indication: history of shoulder dystocia x 2; LGA 10/18/2016   Postpartum care following cesarean delivery (10/11) 10/18/2016   Asthma 02/08/2011   OCD (obsessive compulsive disorder) 02/08/2011   Urolithiasis 02/08/2011    Past Surgical History:  Procedure Laterality Date   CESAREAN SECTION N/A 10/18/2016    Procedure: Primary CESAREAN SECTION;  Surgeon: Shea Evans, MD;  Location: [facility] BIRTHING SUIT[facility];  Service: Obstetrics;  Laterality: N/A;  EDD: 10/25/16   TONSILLECTOMY     age 82   WISDOM TOOTH EXTRACTION  age 82     OB History     Gravida  3   Para  3   Term  2   Preterm  0   AB  0   Living  3      SAB  0   IAB  0   Ectopic  0   Multiple  0   Live Births  3           Family History  Problem Relation Age of Onset   Hypertension Mother    Asthma Mother    Thyroid disease Mother    Other Mother        hypotension   Hypertension Maternal Grandmother    Cancer Maternal Grandfather        lymphoma   Cancer Paternal Grandmother        liver   Asthma Brother    Hypertension Maternal Uncle    Thyroid disease Maternal Uncle     Social History   Tobacco Use   Smoking status: Never   Smokeless tobacco: Never  Vaping Use   Vaping Use: Never used  Substance Use Topics   Alcohol use: No   Drug use: No    Home Medications Prior to Admission medications   Medication Sig Start  Date End Date Taking? Authorizing Provider  ALPRAZolam Prudy Feeler) 0.5 MG tablet Take 1 tablet (0.5 mg total) by mouth 2 (two) times daily as needed for anxiety (Panic attack). 04/20/20   Mozingo, Thereasa Solo, NP  doxycycline (VIBRA-TABS) 100 MG tablet Take 100 mg by mouth 2 (two) times daily.    [provider]  rosuvastatin (CRESTOR) 40 MG tablet Take 1 tablet (40 mg total) by mouth daily. 12/09/19   Christell Constant, MD  sertraline (ZOLOFT) 100 MG tablet Take 1.5 tablets (150 mg total) by mouth at bedtime. 04/20/20   Mozingo, Thereasa Solo, NP    Allergies    Patient has no known allergies.  Review of Systems   Review of Systems  Constitutional:  Negative for chills and fever.  HENT:  Negative for ear pain and sore throat.   Eyes:  Positive for pain. Negative for photophobia, discharge, itching and visual disturbance.  Respiratory:  Negative for cough and  shortness of breath.   Cardiovascular:  Negative for chest pain and palpitations.  Gastrointestinal:  Negative for abdominal pain and vomiting.  Genitourinary:  Negative for dysuria and hematuria.  Musculoskeletal:  Negative for arthralgias and back pain.  Skin:  Negative for color change and rash.  Neurological:  Negative for seizures and syncope.  All other systems reviewed and are negative.  Physical Exam Updated Vital Signs BP 123/74   Pulse 70   Temp 98.3 F (36.8 C) (Oral)   Resp 18   SpO2 100%   Physical Exam Vitals and nursing note reviewed.  Constitutional:      General: She is not in acute distress.    Appearance: She is well-developed.  HENT:     Head: Normocephalic and atraumatic.   Eyes:     General: Lids are normal. Vision grossly intact. Gaze aligned appropriately.     Extraocular Movements: Extraocular movements intact.     Conjunctiva/sclera: Conjunctivae normal.     Pupils: Pupils are equal, round, and reactive to light.     Visual Fields: Right eye visual fields normal and left eye visual fields normal.   Cardiovascular:     Rate and Rhythm: Normal rate and regular rhythm.     Heart sounds: No murmur heard. Pulmonary:     Effort: Pulmonary effort is normal. No respiratory distress.     Breath sounds: Normal breath sounds.  Abdominal:     Palpations: Abdomen is soft.     Tenderness: There is no abdominal tenderness.  Musculoskeletal:     Cervical back: Neck supple.  Skin:    General: Skin is warm and dry.  Neurological:     Mental Status: She is alert.    ED Results / Procedures / Treatments   Labs (all labs ordered are listed, but only abnormal results are displayed) Labs Reviewed  CBC WITH DIFFERENTIAL/PLATELET  BASIC METABOLIC PANEL  HCG, SERUM, QUALITATIVE    EKG None  Radiology No results found.  Procedures Procedures   Medications Ordered in ED Medications - No data to display  ED Course  I have reviewed the triage vital  signs and the nursing notes.  Pertinent labs & imaging results that were available during my care of the patient were reviewed by me and considered in my medical decision making (see chart for details).    MDM Rules/Calculators/A&P                         5:14 PM  35 yo  female presenting for eye pain. Patient is Aox3, no acute distress, afebrile, with stable vitals. Physical exam demonstrates periorbital cellulitis. PERRL, vision intact, pain with EOM. CT orbit ordered to r/o orbital cellulitis.   CT orbit demonstrates no orbital cellulitis. Pt already on doxycyline with no improvement. Pt's care discussed with on call infectious disease specialist for failed outpatient treatment of periorbital cellulitis with MRSA cultures and recommended for clindamycin and follow up outpatient with pcp.   Patient in no distress and overall condition improved here in the ED. Detailed discussions were had with the patient regarding current findings, and need for close f/u with PCP or on call doctor. The patient has been instructed to return immediately if the symptoms worsen in any way for re-evaluation. Patient verbalized understanding and is in agreement with current care plan. All questions answered prior to discharge.   Final Clinical Impression(s) / ED Diagnoses Final diagnoses:  Periorbital cellulitis of right eye    Rx / DC Orders ED Discharge Orders     None        Franne Forts, DO 09/03/20 1646

## 2020-09-01 NOTE — Discharge Instructions (Addendum)
Please return to ED for vision changes

## 2020-09-01 NOTE — ED Triage Notes (Signed)
Pt presents to ED POV. Pt c/o infection to R eyebrow. Pt reports that she was seen here recently and just received results that she is MRSA+. Pt reports that current antibiotics dont treat MRSA so she was coming to get rechecked and new treatment

## 2020-09-08 DIAGNOSIS — L0201 Cutaneous abscess of face: Secondary | ICD-10-CM | POA: Diagnosis not present

## 2020-09-08 DIAGNOSIS — H57813 Brow ptosis, bilateral: Secondary | ICD-10-CM | POA: Diagnosis not present

## 2020-09-08 DIAGNOSIS — L814 Other melanin hyperpigmentation: Secondary | ICD-10-CM | POA: Diagnosis not present

## 2020-09-14 DIAGNOSIS — M9902 Segmental and somatic dysfunction of thoracic region: Secondary | ICD-10-CM | POA: Diagnosis not present

## 2020-09-14 DIAGNOSIS — M9908 Segmental and somatic dysfunction of rib cage: Secondary | ICD-10-CM | POA: Diagnosis not present

## 2020-09-14 DIAGNOSIS — M5386 Other specified dorsopathies, lumbar region: Secondary | ICD-10-CM | POA: Diagnosis not present

## 2020-09-14 DIAGNOSIS — M9901 Segmental and somatic dysfunction of cervical region: Secondary | ICD-10-CM | POA: Diagnosis not present

## 2020-09-24 DIAGNOSIS — R509 Fever, unspecified: Secondary | ICD-10-CM | POA: Diagnosis not present

## 2020-09-24 DIAGNOSIS — J069 Acute upper respiratory infection, unspecified: Secondary | ICD-10-CM | POA: Diagnosis not present

## 2020-09-24 DIAGNOSIS — J029 Acute pharyngitis, unspecified: Secondary | ICD-10-CM | POA: Diagnosis not present

## 2020-10-10 DIAGNOSIS — M9908 Segmental and somatic dysfunction of rib cage: Secondary | ICD-10-CM | POA: Diagnosis not present

## 2020-10-10 DIAGNOSIS — M5386 Other specified dorsopathies, lumbar region: Secondary | ICD-10-CM | POA: Diagnosis not present

## 2020-10-10 DIAGNOSIS — M9902 Segmental and somatic dysfunction of thoracic region: Secondary | ICD-10-CM | POA: Diagnosis not present

## 2020-10-10 DIAGNOSIS — M9901 Segmental and somatic dysfunction of cervical region: Secondary | ICD-10-CM | POA: Diagnosis not present

## 2020-10-11 DIAGNOSIS — M9901 Segmental and somatic dysfunction of cervical region: Secondary | ICD-10-CM | POA: Diagnosis not present

## 2020-10-11 DIAGNOSIS — M9908 Segmental and somatic dysfunction of rib cage: Secondary | ICD-10-CM | POA: Diagnosis not present

## 2020-10-11 DIAGNOSIS — M5386 Other specified dorsopathies, lumbar region: Secondary | ICD-10-CM | POA: Diagnosis not present

## 2020-10-11 DIAGNOSIS — M9902 Segmental and somatic dysfunction of thoracic region: Secondary | ICD-10-CM | POA: Diagnosis not present

## 2020-10-12 DIAGNOSIS — M9901 Segmental and somatic dysfunction of cervical region: Secondary | ICD-10-CM | POA: Diagnosis not present

## 2020-10-12 DIAGNOSIS — M5386 Other specified dorsopathies, lumbar region: Secondary | ICD-10-CM | POA: Diagnosis not present

## 2020-10-12 DIAGNOSIS — M9908 Segmental and somatic dysfunction of rib cage: Secondary | ICD-10-CM | POA: Diagnosis not present

## 2020-10-12 DIAGNOSIS — M9902 Segmental and somatic dysfunction of thoracic region: Secondary | ICD-10-CM | POA: Diagnosis not present

## 2020-10-17 DIAGNOSIS — M542 Cervicalgia: Secondary | ICD-10-CM | POA: Diagnosis not present

## 2020-10-25 DIAGNOSIS — M542 Cervicalgia: Secondary | ICD-10-CM | POA: Diagnosis not present

## 2020-11-01 DIAGNOSIS — M542 Cervicalgia: Secondary | ICD-10-CM | POA: Diagnosis not present

## 2020-11-08 DIAGNOSIS — R051 Acute cough: Secondary | ICD-10-CM | POA: Diagnosis not present

## 2020-11-08 DIAGNOSIS — Z20828 Contact with and (suspected) exposure to other viral communicable diseases: Secondary | ICD-10-CM | POA: Diagnosis not present

## 2020-11-08 DIAGNOSIS — R079 Chest pain, unspecified: Secondary | ICD-10-CM | POA: Diagnosis not present

## 2020-11-08 DIAGNOSIS — R509 Fever, unspecified: Secondary | ICD-10-CM | POA: Diagnosis not present

## 2020-11-09 DIAGNOSIS — R079 Chest pain, unspecified: Secondary | ICD-10-CM | POA: Diagnosis not present

## 2020-11-21 DIAGNOSIS — M9901 Segmental and somatic dysfunction of cervical region: Secondary | ICD-10-CM | POA: Diagnosis not present

## 2020-11-21 DIAGNOSIS — M5386 Other specified dorsopathies, lumbar region: Secondary | ICD-10-CM | POA: Diagnosis not present

## 2020-11-21 DIAGNOSIS — M9902 Segmental and somatic dysfunction of thoracic region: Secondary | ICD-10-CM | POA: Diagnosis not present

## 2020-11-21 DIAGNOSIS — M9908 Segmental and somatic dysfunction of rib cage: Secondary | ICD-10-CM | POA: Diagnosis not present

## 2020-11-22 NOTE — Progress Notes (Deleted)
Cardiology Office Note    Date:  11/22/2020   ID:  NYOMIE EHRLICH, DOB [birth year], MRN [id number]   PCP:  Trisha Mangle, FNP   [facility]  Cardiologist:  None *** Advanced Practice Provider:  No care team member to display Electrophysiologist:  None   [id number]}   No chief complaint on file.   History of Present Illness:  Elizabeth Garrison is a 35 y.o. female with history of familial hyperlipidemia LDL greater than 190 12/2019 started on Lipitor.  Low threshold to send to lipid clinic.    Past Medical History:  Diagnosis Date   Anxiety    as a teen   Asthma 02/08/11   H/O cystitis    H/O pyelonephritis    as adolescent   H/O varicella    History of kidney stones    age 53   Yeast infection     Past Surgical History:  Procedure Laterality Date   CESAREAN SECTION N/A 10/18/2016   Procedure: Primary CESAREAN SECTION;  Surgeon: Shea Evans, MD;  Location: [facility] BIRTHING SUITES;  Service: Obstetrics;  Laterality: N/A;  EDD: 10/25/16   TONSILLECTOMY     age 13   WISDOM TOOTH EXTRACTION  age 27    Current Medications: No outpatient medications have been marked as taking for the 12/06/20 encounter (Appointment) with Dyann Kief, PA-C.     Allergies:   Patient has no known allergies.   Social History   Socioeconomic History   Marital status: Married    Spouse name: Not on file   Number of children: Not on file   Years of education: Not on file   Highest education level: Not on file  Occupational History   Not on file  Tobacco Use   Smoking status: Never   Smokeless tobacco: Never  Vaping Use   Vaping Use: Never used  Substance and Sexual Activity   Alcohol use: No   Drug use: No   Sexual activity: Yes    Birth control/protection: None  Other Topics Concern   Not on file  Social History Narrative   Not on file   Social Determinants of Health   Financial Resource Strain: Not on file  Food Insecurity: Not on  file  Transportation Needs: Not on file  Physical Activity: Not on file  Stress: Not on file  Social Connections: Not on file     Family History:  The patient's ***family history includes Asthma in her brother and mother; Cancer in her maternal grandfather and paternal grandmother; Hypertension in her maternal grandmother, maternal uncle, and mother; Other in her mother; Thyroid disease in her maternal uncle and mother.   ROS:   Please see the history of present illness.    ROS All other systems reviewed and are negative.   PHYSICAL EXAM:   VS:  There were no vitals taken for this visit.  Physical Exam  GEN: Well nourished, well developed, in no acute distress  HEENT: normal  Neck: no JVD, carotid bruits, or masses Cardiac:RRR; no murmurs, rubs, or gallops  Respiratory:  clear to auscultation bilaterally, normal work of breathing GI: soft, nontender, nondistended, + BS Ext: without cyanosis, clubbing, or edema, Good distal pulses bilaterally MS: no deformity or atrophy  Skin: warm and dry, no rash Neuro:  Alert and Oriented x 3, Strength and sensation are intact Psych: euthymic mood, full affect  Wt Readings from Last 3 Encounters:  08/31/20 140 lb (63.5 kg)  12/09/19  142 lb (64.4 kg)  10/18/16 165 lb (74.8 kg)      Studies/Labs Reviewed:   EKG:  EKG is*** ordered today.  The ekg ordered today demonstrates ***  Recent Labs: 09/01/2020: BUN 19; Creatinine, Ser 0.63; Hemoglobin 13.4; Platelets 184; Potassium 3.7; Sodium 138   Lipid Panel No results found for: CHOL, TRIG, HDL, CHOLHDL, VLDL, LDLCALC, LDLDIRECT  Additional studies/ records that were reviewed today include:  ***   Risk Assessment/Calculations:   {Does this patient have ATRIAL FIBRILLATION?[phone]}     ASSESSMENT:    No diagnosis found.   PLAN:  In order of problems listed above:  Familial hyperlipidemia LDL greater than 190 12/2019 started on lipitor last year  Shared Decision  Making/Informed Consent   {Are you ordering a CV Procedure (e.g. stress test, cath, DCCV, TEE, etc)?   Press F2        :[id number]}    Medication Adjustments/Labs and Tests Ordered: Current medicines are reviewed at length with the patient today.  Concerns regarding medicines are outlined above.  Medication changes, Labs and Tests ordered today are listed in the Patient Instructions below. There are no Patient Instructions on file for this visit.   Elson Clan, PA-C  11/22/2020 8:12 AM    [facility] [address] [place], [place], [place]  [address] Phone: [phone]; Fax: [phone]

## 2020-11-23 DIAGNOSIS — M9902 Segmental and somatic dysfunction of thoracic region: Secondary | ICD-10-CM | POA: Diagnosis not present

## 2020-11-23 DIAGNOSIS — M9908 Segmental and somatic dysfunction of rib cage: Secondary | ICD-10-CM | POA: Diagnosis not present

## 2020-11-23 DIAGNOSIS — M5386 Other specified dorsopathies, lumbar region: Secondary | ICD-10-CM | POA: Diagnosis not present

## 2020-11-23 DIAGNOSIS — M9901 Segmental and somatic dysfunction of cervical region: Secondary | ICD-10-CM | POA: Diagnosis not present

## 2020-11-28 ENCOUNTER — Other Ambulatory Visit: Payer: Self-pay

## 2020-11-29 DIAGNOSIS — M5451 Vertebrogenic low back pain: Secondary | ICD-10-CM | POA: Diagnosis not present

## 2020-12-06 ENCOUNTER — Ambulatory Visit: Payer: Self-pay | Admitting: Physician Assistant

## 2020-12-06 DIAGNOSIS — Z01419 Encounter for gynecological examination (general) (routine) without abnormal findings: Secondary | ICD-10-CM | POA: Diagnosis not present

## 2020-12-06 DIAGNOSIS — Z6824 Body mass index (BMI) 24.0-24.9, adult: Secondary | ICD-10-CM | POA: Diagnosis not present

## 2020-12-06 DIAGNOSIS — E7801 Familial hypercholesterolemia: Secondary | ICD-10-CM

## 2020-12-06 DIAGNOSIS — Z124 Encounter for screening for malignant neoplasm of cervix: Secondary | ICD-10-CM | POA: Diagnosis not present

## 2021-01-25 DIAGNOSIS — L814 Other melanin hyperpigmentation: Secondary | ICD-10-CM | POA: Diagnosis not present

## 2021-01-25 DIAGNOSIS — H57813 Brow ptosis, bilateral: Secondary | ICD-10-CM | POA: Diagnosis not present

## 2021-01-25 DIAGNOSIS — L0201 Cutaneous abscess of face: Secondary | ICD-10-CM | POA: Diagnosis not present

## 2021-01-27 DIAGNOSIS — H57813 Brow ptosis, bilateral: Secondary | ICD-10-CM | POA: Diagnosis not present

## 2021-01-27 DIAGNOSIS — L0201 Cutaneous abscess of face: Secondary | ICD-10-CM | POA: Diagnosis not present

## 2021-01-27 DIAGNOSIS — L814 Other melanin hyperpigmentation: Secondary | ICD-10-CM | POA: Diagnosis not present

## 2021-02-06 NOTE — Progress Notes (Deleted)
Cardiology Office Note:    Date:  02/06/2021   ID:  Elizabeth Garrison, DOB [birth year], MRN [id number]  PCP:  Trisha Mangle, FNP  [facility] Cardiologist:  None  [facility] Electrophysiologist:  None   CC: hyperlipidemia  History of Present Illness:    Elizabeth Garrison is a 36 y.o. female with a hx of OCD and Depression, Asthma, prior COVID infection who was evaluated for FH.  Seen 12/21 and started on high intensity statin.  02/07/21.  Patient notes that she is doing ***.   Since day prior/last visit notes *** .  No chest pain or pressure ***.  No SOB/DOE*** and no PND/Orthopnea***.  No weight gain or leg swelling***.  No palpitations or syncope ***.  Ambulatory blood pressure ***.    Past Medical History:  Diagnosis Date   Anxiety    as a teen   Asthma 02/08/11   H/O cystitis    H/O pyelonephritis    as adolescent   H/O varicella    History of kidney stones    age 34   Yeast infection     Past Surgical History:  Procedure Laterality Date   CESAREAN SECTION N/A 10/18/2016   Procedure: Primary CESAREAN SECTION;  Surgeon: Shea Evans, MD;  Location: [facility] BIRTHING SUITES;  Service: Obstetrics;  Laterality: N/A;  EDD: 10/25/16   TONSILLECTOMY     age 64   WISDOM TOOTH EXTRACTION  age 52    Current Medications: No outpatient medications have been marked as taking for the 02/07/21 encounter (Appointment) with Christell Constant, MD.     Allergies:   Patient has no known allergies.   Social History   Socioeconomic History   Marital status: Married    Spouse name: Not on file   Number of children: Not on file   Years of education: Not on file   Highest education level: Not on file  Occupational History   Not on file  Tobacco Use   Smoking status: Never   Smokeless tobacco: Never  Vaping Use   Vaping Use: Never used  Substance and Sexual Activity   Alcohol use: No   Drug use: No   Sexual activity: Yes    Birth control/protection: None   Other Topics Concern   Not on file  Social History Narrative   Not on file   Social Determinants of Health   Financial Resource Strain: Not on file  Food Insecurity: Not on file  Transportation Needs: Not on file  Physical Activity: Not on file  Stress: Not on file  Social Connections: Not on file    Family History: The patient's family history includes Asthma in her brother and mother; Cancer in her maternal grandfather and paternal grandmother; Hypertension in her maternal grandmother, maternal uncle, and mother; Other in her mother; Thyroid disease in her maternal uncle and mother. Prior Pacemakers in the past.; has high cholesterol.   ROS:   Please see the history of present illness.    All other systems reviewed and are negative.  EKGs/Labs/Other Studies Reviewed:    The following studies were reviewed today:  EKG:  EKG is ordered today.  The ekg ordered today demonstrates  02/07/21: ***  Recent Labs: 09/01/2020: BUN 19; Creatinine, Ser 0.63; Hemoglobin 13.4; Platelets 184; Potassium 3.7; Sodium 138  Recent Lipid Panel No results found for: CHOL, TRIG, HDL, CHOLHDL, VLDL, LDLCALC, LDLDIRECT   Physical Exam:    VS:  There were no vitals taken for this visit.  Wt Readings from Last 3 Encounters:  08/31/20 63.5 kg  12/09/19 64.4 kg  10/18/16 74.8 kg    Gen: *** distress, *** obese/well nourished/malnourished   Neck: No JVD, *** carotid bruit Ears: Homero Fellers Sign Cardiac: No Rubs or Gallops, *** Murmur, ***cardia, *** radial pulses Respiratory: Clear to auscultation bilaterally, *** effort, ***  respiratory rate GI: Soft, nontender, non-distended *** MS: No *** edema; *** moves all extremities Integument: Skin feels *** Neuro:  At time of evaluation, alert and oriented to person/place/time/situation *** Psych: Normal affect, patient feels ***   ASSESSMENT:    No diagnosis found.  PLAN:     Familial Hyperlipidemia -LDL goal less than 100 - LDL above  190 - Meets indication for high intensity statin - low threshold to send to lipid clinic for additional patient support -Recheck lipid profile LFTs in three months - nutrition consult - gave education on dietary changes at length    Shared Decision Making/Informed Consent      Patient amenable to plan  Medication Adjustments/Labs and Tests Ordered: Current medicines are reviewed at length with the patient today.  Concerns regarding medicines are outlined above.  No orders of the defined types were placed in this encounter.  No orders of the defined types were placed in this encounter.   There are no Patient Instructions on file for this visit.   Signed, Christell Constant, MD  02/06/2021 8:10 AM    [facility]

## 2021-02-07 ENCOUNTER — Telehealth: Payer: Self-pay | Admitting: Internal Medicine

## 2021-02-07 ENCOUNTER — Ambulatory Visit: Payer: Self-pay | Admitting: Internal Medicine

## 2021-02-07 DIAGNOSIS — E7801 Familial hypercholesterolemia: Secondary | ICD-10-CM

## 2021-02-07 NOTE — Telephone Encounter (Signed)
Called pt in regards to lab work.  Pt does not take prescribed rosuvastatin.  Expressed that to young to take medication.  Pt would prefer to have levels check without taking medication to ensure she needs medication.  Orders placed for FLP and LFT advised to fast 8 -12 hours prior to lab draw.  No further questions or  concerns.

## 2021-02-07 NOTE — Telephone Encounter (Signed)
Patient states she did not get her labs done for her appointment today so she rescheduled, but her lab orders have been cancelled. She would like a call back when the orders are in so she can reschedule the lab.

## 2021-02-13 DIAGNOSIS — G8929 Other chronic pain: Secondary | ICD-10-CM | POA: Diagnosis not present

## 2021-02-13 DIAGNOSIS — E782 Mixed hyperlipidemia: Secondary | ICD-10-CM | POA: Diagnosis not present

## 2021-02-13 DIAGNOSIS — M25572 Pain in left ankle and joints of left foot: Secondary | ICD-10-CM | POA: Diagnosis not present

## 2021-02-13 DIAGNOSIS — M545 Low back pain, unspecified: Secondary | ICD-10-CM | POA: Diagnosis not present

## 2021-02-13 DIAGNOSIS — E538 Deficiency of other specified B group vitamins: Secondary | ICD-10-CM | POA: Diagnosis not present

## 2021-02-17 DIAGNOSIS — L814 Other melanin hyperpigmentation: Secondary | ICD-10-CM | POA: Diagnosis not present

## 2021-02-17 DIAGNOSIS — L700 Acne vulgaris: Secondary | ICD-10-CM | POA: Diagnosis not present

## 2021-02-17 DIAGNOSIS — H57813 Brow ptosis, bilateral: Secondary | ICD-10-CM | POA: Diagnosis not present

## 2021-02-27 DIAGNOSIS — M47816 Spondylosis without myelopathy or radiculopathy, lumbar region: Secondary | ICD-10-CM | POA: Diagnosis not present

## 2021-03-07 DIAGNOSIS — M47816 Spondylosis without myelopathy or radiculopathy, lumbar region: Secondary | ICD-10-CM | POA: Diagnosis not present

## 2021-03-21 DIAGNOSIS — M47816 Spondylosis without myelopathy or radiculopathy, lumbar region: Secondary | ICD-10-CM | POA: Diagnosis not present

## 2021-03-29 ENCOUNTER — Other Ambulatory Visit: Payer: Self-pay

## 2021-04-03 NOTE — Progress Notes (Deleted)
?Cardiology Office Note:   ? ?Date:  04/03/2021  ? ?ID:  Elizabeth Garrison, DOB Jan 11, 1985, MRN [id number] ? ?PCP:  Trisha Mangle, FNP  ?[facility] Cardiologist:  None  ?[facility] Electrophysiologist:  None  ? ?CC: hyperlipidemia  ? ?History of Present Illness:   ? ?Elizabeth Garrison is a 36 y.o. female with a hx of OCD and Depression, Asthma, prior COVID infection. ?Seen in 2022 for FH evaluation. ? ?Patient notes that she is doing ***.   ?Since day prior/last visit notes *** . ?There are no*** interval hospital/ED visit.   ? ?No chest pain or pressure ***.  No SOB/DOE*** and no PND/Orthopnea***.  No weight gain or leg swelling***.  No palpitations or syncope ***. ? ?Ambulatory blood pressure ***. ? ? ? ?Past Medical History:  ?Diagnosis Date  ? Anxiety   ? as a teen  ? Asthma 02/08/11  ? H/O cystitis   ? H/O pyelonephritis   ? as adolescent  ? H/O varicella   ? History of kidney stones   ? age 32  ? Yeast infection   ? ? ?Past Surgical History:  ?Procedure Laterality Date  ? CESAREAN SECTION N/A 10/18/2016  ? Procedure: Primary CESAREAN SECTION;  Surgeon: Shea Evans, MD;  Location: [facility] BIRTHING SUITES;  Service: Obstetrics;  Laterality: N/A;  EDD: 10/25/16  ? TONSILLECTOMY    ? age 9  ? WISDOM TOOTH EXTRACTION  age 82  ? ? ?Current Medications: ?No outpatient medications have been marked as taking for the 04/05/21 encounter (Appointment) with Christell Constant, MD.  ?  ? ?Allergies:   Patient has no known allergies.  ? ?Social History  ? ?Socioeconomic History  ? Marital status: Married  ?  Spouse name: Not on file  ? Number of children: Not on file  ? Years of education: Not on file  ? Highest education level: Not on file  ?Occupational History  ? Not on file  ?Tobacco Use  ? Smoking status: Never  ? Smokeless tobacco: Never  ?Vaping Use  ? Vaping Use: Never used  ?Substance and Sexual Activity  ? Alcohol use: No  ? Drug use: No  ? Sexual activity: Yes  ?  Birth control/protection: None  ?Other  Topics Concern  ? Not on file  ?Social History Narrative  ? Not on file  ? ?Social Determinants of Health  ? ?Financial Resource Strain: Not on file  ?Food Insecurity: Not on file  ?Transportation Needs: Not on file  ?Physical Activity: Not on file  ?Stress: Not on file  ?Social Connections: Not on file  ?  ?Family History: ?The patient's family history includes Asthma in her brother and mother; Cancer in her maternal grandfather and paternal grandmother; Hypertension in her maternal grandmother, maternal uncle, and mother; Other in her mother; Thyroid disease in her maternal uncle and mother. ?Prior Pacemakers in the past.; has high cholesterol.  ? ?ROS:   ?Please see the history of present illness.    ?All other systems reviewed and are negative. ? ?EKGs/Labs/Other Studies Reviewed:   ? ?The following studies were reviewed today: ? ?EKG:  EKG is ordered today.  The ekg ordered today demonstrates SR rate 68 without ST/T changes. ? ?Recent Labs: ?09/01/2020: BUN 19; Creatinine, Ser 0.63; Hemoglobin 13.4; Platelets 184; Potassium 3.7; Sodium 138  ?Recent Lipid Panel ?No results found for: CHOL, TRIG, HDL, CHOLHDL, VLDL, LDLCALC, LDLDIRECT ? ?OSH Labs 10/15/19: ?LDL 213 ?Cholesterol 288 ?TG 92 ?HDL 61 ?AST/ALT 18/15 ? ? ?  Physical Exam:   ? ?VS:  There were no vitals taken for this visit.   ? ?Wt Readings from Last 3 Encounters:  ?08/31/20 140 lb (63.5 kg)  ?12/09/19 142 lb (64.4 kg)  ?10/18/16 165 lb (74.8 kg)  ?  ?GEN:  Well nourished, well developed in no acute distress ?HEENT: Normal ?NECK: No JVD; No carotid bruits ?LYMPHATICS: No lymphadenopathy ?CARDIAC: RRR, no murmurs, rubs, gallops ?RESPIRATORY:  Clear to auscultation without rales, wheezing or rhonchi  ?ABDOMEN: Soft, non-tender, non-distended ?MUSCULOSKELETAL:  No edema; No deformity, no tendon xanthoma ?SKIN: Warm and dry ?NEUROLOGIC:  Alert and oriented x 3 ?PSYCHIATRIC:  Normal affect  ? ?ASSESSMENT:   ? ?No diagnosis found. ? ?PLAN:   ? ?Familial  Hyperlipidemia ?-LDL goal less than 100 ?- LDL above 190 ?- Meets indication for high intensity statin ?- low threshold to send to lipid clinic for additional patient support ?-Recheck lipids and ALT ?- gave education on dietary changes at length  ? ?One year me or APP ? ? ? ? ?Shared Decision Making/Informed Consent   ?   ?Patient amenable to plan ? ?Medication Adjustments/Labs and Tests Ordered: ?Current medicines are reviewed at length with the patient today.  Concerns regarding medicines are outlined above.  ?No orders of the defined types were placed in this encounter. ? ?No orders of the defined types were placed in this encounter. ? ? ?There are no Patient Instructions on file for this visit.  ? ?Signed, ?Christell Constant, MD  ?04/03/2021 1:02 PM    ?[facility] ? ?

## 2021-04-04 ENCOUNTER — Other Ambulatory Visit: Payer: Self-pay

## 2021-04-04 ENCOUNTER — Other Ambulatory Visit: Payer: BC Managed Care – PPO | Admitting: *Deleted

## 2021-04-04 DIAGNOSIS — E7801 Familial hypercholesterolemia: Secondary | ICD-10-CM | POA: Diagnosis not present

## 2021-04-04 LAB — HEPATIC FUNCTION PANEL
ALT: 16 IU/L (ref 0–32)
AST: 17 IU/L (ref 0–40)
Albumin: 4.9 g/dL — ABNORMAL HIGH (ref 3.8–4.8)
Alkaline Phosphatase: 39 IU/L — ABNORMAL LOW (ref 44–121)
Bilirubin Total: 0.6 mg/dL (ref 0.0–1.2)
Bilirubin, Direct: 0.14 mg/dL (ref 0.00–0.40)
Total Protein: 7 g/dL (ref 6.0–8.5)

## 2021-04-04 LAB — LIPID PANEL
Chol/HDL Ratio: 4.7 ratio — ABNORMAL HIGH (ref 0.0–4.4)
Cholesterol, Total: 250 mg/dL — ABNORMAL HIGH (ref 100–199)
HDL: 53 mg/dL (ref >39)
LDL Chol Calc (NIH): 183 mg/dL — ABNORMAL HIGH (ref 0–99)
Triglycerides: 84 mg/dL (ref 0–149)
VLDL Cholesterol Cal: 14 mg/dL (ref 5–40)

## 2021-04-05 ENCOUNTER — Ambulatory Visit: Payer: Self-pay | Admitting: Internal Medicine

## 2021-04-05 NOTE — Progress Notes (Signed)
?Cardiology Office Note:   ? ?Date:  04/06/2021  ? ?ID:  Elizabeth Garrison, DOB 09/29/1985, MRN [id number] ? ?PCP:  Trisha MangleMorrison, Hayden Byrd, FNP  ?[facility] Cardiologist:  None  ?[facility] Electrophysiologist:  None  ? ?CC: FH f/u ? ?History of Present Illness:   ? ?Elizabeth Garrison is a 36 y.o. female with a hx of OCD and Depression, Asthma, prior COVID infection. ?Seen in 2022 for FH evaluation. ? ?Patient notes that she is doing well.   ?There are no interval hospital/ED visit. ?She did not start this medication.   ? ?She was feeling OK with rosuvastatin and noted some improvement. ?She has some concerns about long term statin use and wants to evaluate other options. ?She notes that her family members have had different perspective on her medications and that she has tried dietary interventions without significant improvement. ? ?No chest pain or pressure.  No SOB/DOE and no PND/Orthopnea.  No weight gain or leg swelling.  No palpitations or syncope. ? ? ?Past Medical History:  ?Diagnosis Date  ? Anxiety   ? as a teen  ? Asthma 02/08/11  ? H/O cystitis   ? H/O pyelonephritis   ? as adolescent  ? H/O varicella   ? History of kidney stones   ? age 89  ? Yeast infection   ? ? ?Past Surgical History:  ?Procedure Laterality Date  ? CESAREAN SECTION N/A 10/18/2016  ? Procedure: Primary CESAREAN SECTION;  Surgeon: Shea EvansMody, Vaishali, MD;  [facility];  Service: Obstetrics;  Laterality: N/A;  EDD: 10/25/16  ? TONSILLECTOMY    ? age 36  ? WISDOM TOOTH EXTRACTION  age 36  ? ? ?Current Medications: ?Current Meds  ?Medication Sig  ? albuterol (VENTOLIN HFA) 108 (90 Base) MCG/ACT inhaler Inhale into the lungs as needed.  ? ALPRAZolam (XANAX) 0.5 MG tablet Take 1 tablet (0.5 mg total) by mouth 2 (two) times daily as needed for anxiety (Panic attack).  ? sertraline (ZOLOFT) 100 MG tablet Take 1.5 tablets (150 mg total) by mouth at bedtime.  ?  ? ?Allergies:   Patient has no known allergies.  ? ?Social History   ? ?Socioeconomic History  ? Marital status: Married  ?  Spouse name: Not on file  ? Number of children: Not on file  ? Years of education: Not on file  ? Highest education level: Not on file  ?Occupational History  ? Not on file  ?Tobacco Use  ? Smoking status: Never  ? Smokeless tobacco: Never  ?Vaping Use  ? Vaping Use: Never used  ?Substance and Sexual Activity  ? Alcohol use: No  ? Drug use: No  ? Sexual activity: Yes  ?  Birth control/protection: None  ?Other Topics Concern  ? Not on file  ?Social History Narrative  ? Not on file  ? ?Social Determinants of Health  ? ?Financial Resource Strain: Not on file  ?Food Insecurity: Not on file  ?Transportation Needs: Not on file  ?Physical Activity: Not on file  ?Stress: Not on file  ?Social Connections: Not on file  ?  ?Family History: ?The patient's family history includes Asthma in her brother and mother; Cancer in her maternal grandfather and paternal grandmother; Hypertension in her maternal grandmother, maternal uncle, and mother; Other in her mother; Thyroid disease in her maternal uncle and mother. ?Prior Pacemakers in the past.; has high cholesterol.  ? ?ROS:   ?Please see the history of present illness.    ?All  other systems reviewed and are negative. ? ?EKGs/Labs/Other Studies Reviewed:   ? ?The following studies were reviewed today: ? ?EKG:  EKG is ordered today.   ?04/06/21: Sinus bradycardia rate 59 ?2022:SR rate 68 without ST/T changes. ? ?Recent Labs: ?09/01/2020: BUN 19; Creatinine, Ser 0.63; Hemoglobin 13.4; Platelets 184; Potassium 3.7; Sodium 138 ?04/04/2021: ALT 16  ?Recent Lipid Panel ?   ?Component Value Date/Time  ? CHOL 250 (H) 04/04/2021 0950  ? TRIG 84 04/04/2021 0950  ? HDL 53 04/04/2021 0950  ? CHOLHDL 4.7 (H) 04/04/2021 0950  ? LDLCALC 183 (H) 04/04/2021 0950  ? ? ?Physical Exam:   ? ?VS:  BP 113/78   Pulse (!) 59   Ht 5\' 4"  (1.626 m)   Wt 128 lb (58.1 kg)   SpO2 100%   BMI 21.97 kg/m?    ? ?Wt Readings from Last 3 Encounters:   ?04/06/21 128 lb (58.1 kg)  ?08/31/20 140 lb (63.5 kg)  ?12/09/19 142 lb (64.4 kg)  ?  ?Gen: No distress   ?Neck: No JVD ?Cardiac: No Rubs or Gallops, no Murmur, regular bradycardia, +2 radial pulses ?Respiratory: Clear to auscultation bilaterally, normal effort, normal  respiratory rate ?GI: Soft, nontender, non-distended  ?MS: No  edema;  moves all extremities ?Integument: Skin feels warm ?Neuro:  At time of evaluation, alert and oriented to person/place/time/situation  ?Psych: Normal affect, patient feels OK ? ? ?ASSESSMENT:   ? ?1. Familial hypercholesterolemia   ? ? ?PLAN:   ? ?Familial Hyperlipidemia ?-LDL goal less than 100 ?- at next blood draw, will get Lp(a) ?- Meets indication for high intensity statin ?- we have discussed at length the pros and cons of statins, PCSK9i, and inclisiran, she will review the data nad we will make a shared decision based on results ?- at 40 we may discuss CAC CT ? ? ?Time Spent Directly with Patient: ?  ?I have spent a total of 40 minutes  with the patient reviewing notes, imaging, EKGs, labs and examining the patient as well as establishing an assessment and plan that was discussed personally with the patient.  > 50% of time was spent in direct patient care. ? ? ?One year with me ? ? ? ? ?Shared Decision Making/Informed Consent   ?   ?Patient amenable to plan ? ?Medication Adjustments/Labs and Tests Ordered: ?Current medicines are reviewed at length with the patient today.  Concerns regarding medicines are outlined above.  ?Orders Placed This Encounter  ?Procedures  ? EKG 12-Lead  ? ?No orders of the defined types were placed in this encounter. ? ? ?Patient Instructions  ?Medication Instructions:  ?Your physician recommends that you continue on your current medications as directed. Please refer to the Current Medication list given to you today. ? ?*If you need a refill on your cardiac medications before your next appointment, please call your pharmacy* ? ? ?Lab  Work: ?None ? ?If you have labs (blood work) drawn today and your tests are completely normal, you will receive your results only by: ?MyChart Message (if you have MyChart) OR ?A paper copy in the mail ?If you have any lab test that is abnormal or we need to change your treatment, we will call you to review the results. ? ? ?Testing/Procedures: ?None ? ? ?Follow-Up: ?At [facility], you and your health needs are our priority.  As part of our continuing mission to provide you with exceptional heart care, we have created designated Provider Care Teams.  These Care Teams  include your primary Cardiologist (physician) and Advanced Practice Providers (APPs -  Physician Assistants and Nurse Practitioners) who all work together to provide you with the care you need, when you need it. ? ? ?We have discussed three medications: ?Statins ?PCSK9 inhibitors (such as Repatha) ?Small molecule RNAs (Inclisiran) ? ?Take a look at these and we will talk about them in more detail. ? ? ?Your next appointment:   ?12 month(s) ? ?The format for your next appointment:   ?In Person ? ?Provider:   ?None   ? ? ?Other Instructions ?Send me a MyChart message with your thoughts after researching.  ? ?Signed, ?Christell Constant, MD  ?04/06/2021 12:30 PM    ?[facility] ? ?

## 2021-04-06 ENCOUNTER — Ambulatory Visit: Payer: BC Managed Care – PPO | Admitting: Internal Medicine

## 2021-04-06 ENCOUNTER — Encounter: Payer: Self-pay | Admitting: Internal Medicine

## 2021-04-06 VITALS — BP 113/78 | HR 59 | Ht 64.0 in | Wt 128.0 lb

## 2021-04-06 DIAGNOSIS — E7801 Familial hypercholesterolemia: Secondary | ICD-10-CM | POA: Diagnosis not present

## 2021-04-06 DIAGNOSIS — N6324 Unspecified lump in the left breast, lower inner quadrant: Secondary | ICD-10-CM | POA: Diagnosis not present

## 2021-04-06 NOTE — Patient Instructions (Signed)
Medication Instructions:  ?Your physician recommends that you continue on your current medications as directed. Please refer to the Current Medication list given to you today. ? ?*If you need a refill on your cardiac medications before your next appointment, please call your pharmacy* ? ? ?Lab Work: ?None ? ?If you have labs (blood work) drawn today and your tests are completely normal, you will receive your results only by: ?MyChart Message (if you have MyChart) OR ?A paper copy in the mail ?If you have any lab test that is abnormal or we need to change your treatment, we will call you to review the results. ? ? ?Testing/Procedures: ?None ? ? ?Follow-Up: ?At [facility], you and your health needs are our priority.  As part of our continuing mission to provide you with exceptional heart care, we have created designated Provider Care Teams.  These Care Teams include your primary Cardiologist (physician) and Advanced Practice Providers (APPs -  Physician Assistants and Nurse Practitioners) who all work together to provide you with the care you need, when you need it. ? ? ?We have discussed three medications: ?Statins ?PCSK9 inhibitors (such as Repatha) ?Small molecule RNAs (Inclisiran) ? ?Take a look at these and we will talk about them in more detail. ? ? ?Your next appointment:   ?12 month(s) ? ?The format for your next appointment:   ?In Person ? ?Provider:   ?None   ? ? ?Other Instructions ?Send me a MyChart message with your thoughts after researching. ?

## 2021-04-07 ENCOUNTER — Other Ambulatory Visit: Payer: Self-pay | Admitting: Obstetrics & Gynecology

## 2021-04-07 DIAGNOSIS — N6324 Unspecified lump in the left breast, lower inner quadrant: Secondary | ICD-10-CM

## 2021-04-10 ENCOUNTER — Ambulatory Visit (INDEPENDENT_AMBULATORY_CARE_PROVIDER_SITE_OTHER): Payer: BC Managed Care – PPO | Admitting: Adult Health

## 2021-04-10 ENCOUNTER — Encounter: Payer: Self-pay | Admitting: Adult Health

## 2021-04-10 DIAGNOSIS — F422 Mixed obsessional thoughts and acts: Secondary | ICD-10-CM | POA: Diagnosis not present

## 2021-04-10 DIAGNOSIS — F411 Generalized anxiety disorder: Secondary | ICD-10-CM

## 2021-04-10 MED ORDER — BUPROPION HCL ER (XL) 150 MG PO TB24: 150.0000 mg | ORAL_TABLET | Freq: Every day | ORAL | 2 refills | Status: DC

## 2021-04-10 MED ORDER — SERTRALINE HCL 100 MG PO TABS: 150.0000 mg | ORAL_TABLET | Freq: Every day | ORAL | 3 refills | Status: DC

## 2021-04-10 NOTE — Progress Notes (Signed)
Elizabeth Garrison ?[id number] ?Aug 11, 1985 ?36 y.o. ? ?Subjective:  ? ?Patient ID:  Elizabeth Garrison is a 36 y.o. (DOB July 03, 1985) female. ? ?Chief Complaint: No chief complaint on file. ? ? ?HPI ?Elizabeth Garrison presents to the office today for follow-up of OCD and GAD. ? ?Describes mood today as "ok". Pleasant. Mood symptoms - feels depressed. More anxious. Less irritable.   ?Feels overwhelmed at times. Stating "I'm not doing as well as I was". Business has increased. Had to put down her dog recently. Stating "I feel kind of blah". Reports a low Vitamin D level. Willing to consider other medication options - Wellbutrin XL 150mg  every morning. Taking the Zoloft 150mg  daily. Varying interest and motivation. Taking medications as prescribed. ?Energy levels stable - has nervous energy. Active, has a regular exercise routine.  ?Enjoys some usual interests and activities. Married. Lives with husband and 3 children. Spending time with family 3 children 4, 7, and 9. ?Appetite adequate. Weight loss 20 pounds. ?Sleeps well most nights. Averages 8 hours. ?Focus and concentration stable. Completing tasks. Managing aspects of household. Works full time - . ?Denies SI or HI.  ?Denies AH or VH. ? ?Previous medication trials: Prozac ? ? ?Flowsheet Row ED from 08/31/2020 in [facility]  ?C-SSRS RISK CATEGORY No Risk  ? ?  ?  ? ?Review of Systems:  ?Review of Systems  ?Musculoskeletal:  Negative for gait problem.  ?Neurological:  Negative for tremors.  ?Psychiatric/Behavioral:    ?     Please refer to HPI  ? ?Medications: I have reviewed the patient's current medications. ? ?Current Outpatient Medications  ?Medication Sig Dispense Refill  ? buPROPion (WELLBUTRIN XL) 150 MG 24 hr tablet Take 1 tablet (150 mg total) by mouth daily. 30 tablet 2  ? albuterol (VENTOLIN HFA) 108 (90 Base) MCG/ACT inhaler Inhale into the lungs as needed.    ? ALPRAZolam (XANAX) 0.5 MG tablet Take 1 tablet (0.5 mg total) by  mouth 2 (two) times daily as needed for anxiety (Panic attack). 180 tablet 0  ? sertraline (ZOLOFT) 100 MG tablet Take 1.5 tablets (150 mg total) by mouth at bedtime. 135 tablet 3  ? ?No current facility-administered medications for this visit.  ? ? ?Medication Side Effects: None ? ?Allergies: No Known Allergies ? ?Past Medical History:  ?Diagnosis Date  ? Anxiety   ? as a teen  ? Asthma 02/08/11  ? H/O cystitis   ? H/O pyelonephritis   ? as adolescent  ? H/O varicella   ? History of kidney stones   ? age 44  ? Yeast infection   ? ? ?Past Medical History, Surgical history, Social history, and Family history were reviewed and updated as appropriate.  ? ?Please see review of systems for further details on the patient's review from today.  ? ?Objective:  ? ?Physical Exam:  ?There were no vitals taken for this visit. ? ?Physical Exam ?Constitutional:   ?   General: She is not in acute distress. ?Musculoskeletal:     ?   General: No deformity.  ?Neurological:  ?   Mental Status: She is alert and oriented to person, place, and time.  ?   Coordination: Coordination normal.  ?Psychiatric:     ?   Attention and Perception: Attention and perception normal. She does not perceive auditory or visual hallucinations.     ?   Mood and Affect: Mood normal. Mood is not anxious or depressed. Affect is not labile, blunt,  angry or inappropriate.     ?   Speech: Speech normal.     ?   Behavior: Behavior normal.     ?   Thought Content: Thought content normal. Thought content is not paranoid or delusional. Thought content does not include homicidal or suicidal ideation. Thought content does not include homicidal or suicidal plan.     ?   Cognition and Memory: Cognition and memory normal.     ?   Judgment: Judgment normal.  ?   Comments: Insight intact  ? ? ?Lab Review:  ?   ?Component Value Date/Time  ? NA 138 09/01/2020 1724  ? K 3.7 09/01/2020 1724  ? CL 103 09/01/2020 1724  ? CO2 25 09/01/2020 1724  ? GLUCOSE 92 09/01/2020 1724  ? BUN  19 09/01/2020 1724  ? CREATININE 0.63 09/01/2020 1724  ? CALCIUM 9.3 09/01/2020 1724  ? PROT 7.0 04/04/2021 0950  ? ALBUMIN 4.9 (H) 04/04/2021 0950  ? AST 17 04/04/2021 0950  ? ALT 16 04/04/2021 0950  ? ALKPHOS 39 (L) 04/04/2021 0950  ? BILITOT 0.6 04/04/2021 0950  ? GFRNONAA >60 09/01/2020 1724  ? GFRAA >60 10/12/2016 1156  ? ? ?   ?Component Value Date/Time  ? WBC 6.4 09/01/2020 1724  ? RBC 4.43 09/01/2020 1724  ? HGB 13.4 09/01/2020 1724  ? HCT 39.1 09/01/2020 1724  ? PLT 184 09/01/2020 1724  ? MCV 88.3 09/01/2020 1724  ? MCH 30.2 09/01/2020 1724  ? MCHC 34.3 09/01/2020 1724  ? RDW 11.9 09/01/2020 1724  ? LYMPHSABS 2.0 09/01/2020 1724  ? MONOABS 0.5 09/01/2020 1724  ? EOSABS 0.1 09/01/2020 1724  ? BASOSABS 0.0 09/01/2020 1724  ? ? ?No results found for: POCLITH, LITHIUM  ? ?No results found for: PHENYTOIN, PHENOBARB, VALPROATE, CBMZ  ? ?.res ?Assessment: Plan:   ? ?Plan: ? ?PDMP reviewed ? ?1. Zoloft 150mg  daily ?2. Add Wellbutrin XL 150mg  every morning - denies seizure history - plans to review medication before starting. ? ?RTC 4 weeks ? ?Patient advised to contact office with any questions, adverse effects, or acute worsening in signs and symptoms. ? ?Time spent with patient was 20 minutes. Greater than 50% of face to face time with patient was spent on counseling and coordination of care. We discussed    ? ?Diagnoses and all orders for this visit: ? ?Generalized anxiety disorder ? ?Mixed obsessional thoughts and acts ?-     sertraline (ZOLOFT) 100 MG tablet; Take 1.5 tablets (150 mg total) by mouth at bedtime. ? ?Other orders ?-     buPROPion (WELLBUTRIN XL) 150 MG 24 hr tablet; Take 1 tablet (150 mg total) by mouth daily. ? ?  ? ?Please see After Visit Summary for patient specific instructions. ? ?Future Appointments  ?Date Time Provider Department Center  ?10/10/2021  8:00 AM Courvoisier Hamblen, , NP [facility] None  ? ? ?No orders of the defined types were placed in this  encounter. ? ? ?------------------------------- ?

## 2021-04-13 ENCOUNTER — Ambulatory Visit: Payer: BC Managed Care – PPO | Admitting: Adult Health

## 2021-05-12 DIAGNOSIS — L03211 Cellulitis of face: Secondary | ICD-10-CM | POA: Diagnosis not present

## 2021-06-07 DIAGNOSIS — R922 Inconclusive mammogram: Secondary | ICD-10-CM | POA: Diagnosis not present

## 2021-06-07 DIAGNOSIS — N6324 Unspecified lump in the left breast, lower inner quadrant: Secondary | ICD-10-CM | POA: Diagnosis not present

## 2021-06-07 DIAGNOSIS — N644 Mastodynia: Secondary | ICD-10-CM | POA: Diagnosis not present

## 2021-06-22 DIAGNOSIS — D225 Melanocytic nevi of trunk: Secondary | ICD-10-CM | POA: Diagnosis not present

## 2021-06-22 DIAGNOSIS — L858 Other specified epidermal thickening: Secondary | ICD-10-CM | POA: Diagnosis not present

## 2021-06-22 DIAGNOSIS — D2271 Melanocytic nevi of right lower limb, including hip: Secondary | ICD-10-CM | POA: Diagnosis not present

## 2021-06-22 DIAGNOSIS — D2272 Melanocytic nevi of left lower limb, including hip: Secondary | ICD-10-CM | POA: Diagnosis not present

## 2021-07-13 DIAGNOSIS — J028 Acute pharyngitis due to other specified organisms: Secondary | ICD-10-CM | POA: Diagnosis not present

## 2021-07-18 DIAGNOSIS — B309 Viral conjunctivitis, unspecified: Secondary | ICD-10-CM | POA: Diagnosis not present

## 2021-10-10 ENCOUNTER — Ambulatory Visit (INDEPENDENT_AMBULATORY_CARE_PROVIDER_SITE_OTHER): Payer: Self-pay | Admitting: Adult Health

## 2021-10-10 DIAGNOSIS — F489 Nonpsychotic mental disorder, unspecified: Secondary | ICD-10-CM

## 2021-10-10 NOTE — Progress Notes (Signed)
Patient no show appointment. ? ?

## 2021-10-30 ENCOUNTER — Other Ambulatory Visit: Payer: Self-pay

## 2021-10-30 ENCOUNTER — Encounter (HOSPITAL_BASED_OUTPATIENT_CLINIC_OR_DEPARTMENT_OTHER): Payer: Self-pay | Admitting: Emergency Medicine

## 2021-10-30 ENCOUNTER — Emergency Department (HOSPITAL_BASED_OUTPATIENT_CLINIC_OR_DEPARTMENT_OTHER)
Admission: EM | Admit: 2021-10-30 | Discharge: 2021-10-30 | Disposition: A | Discharge status: Home / Self Care | Payer: BC Managed Care – PPO | Attending: Emergency Medicine | Admitting: Emergency Medicine

## 2021-10-30 DIAGNOSIS — J45909 Unspecified asthma, uncomplicated: Secondary | ICD-10-CM | POA: Diagnosis not present

## 2021-10-30 DIAGNOSIS — A09 Infectious gastroenteritis and colitis, unspecified: Secondary | ICD-10-CM | POA: Diagnosis not present

## 2021-10-30 DIAGNOSIS — R9431 Abnormal electrocardiogram [ECG] [EKG]: Secondary | ICD-10-CM | POA: Diagnosis not present

## 2021-10-30 DIAGNOSIS — G43809 Other migraine, not intractable, without status migrainosus: Secondary | ICD-10-CM | POA: Diagnosis not present

## 2021-10-30 DIAGNOSIS — R519 Headache, unspecified: Secondary | ICD-10-CM | POA: Diagnosis not present

## 2021-10-30 LAB — CBC WITH DIFFERENTIAL/PLATELET
Abs Immature Granulocytes: 0 10*3/uL (ref 0.00–0.07)
Basophils Absolute: 0 10*3/uL (ref 0.0–0.1)
Basophils Relative: 1 %
Eosinophils Absolute: 0.1 10*3/uL (ref 0.0–0.5)
Eosinophils Relative: 3 %
HCT: 39.7 % (ref 36.0–46.0)
Hemoglobin: 13.8 g/dL (ref 12.0–15.0)
Immature Granulocytes: 0 %
Lymphocytes Relative: 39 %
Lymphs Abs: 1.4 10*3/uL (ref 0.7–4.0)
MCH: 30 pg (ref 26.0–34.0)
MCHC: 34.8 g/dL (ref 30.0–36.0)
MCV: 86.3 fL (ref 80.0–100.0)
Monocytes Absolute: 0.4 10*3/uL (ref 0.1–1.0)
Monocytes Relative: 11 %
Neutro Abs: 1.7 10*3/uL (ref 1.7–7.7)
Neutrophils Relative %: 46 %
Platelets: 160 10*3/uL (ref 150–400)
RBC: 4.6 MIL/uL (ref 3.87–5.11)
RDW: 11.9 % (ref 11.5–15.5)
WBC: 3.6 10*3/uL — ABNORMAL LOW (ref 4.0–10.5)
nRBC: 0 % (ref 0.0–0.2)

## 2021-10-30 LAB — COMPREHENSIVE METABOLIC PANEL
ALT: 19 U/L (ref 0–44)
AST: 26 U/L (ref 15–41)
Albumin: 4.8 g/dL (ref 3.5–5.0)
Alkaline Phosphatase: 37 U/L — ABNORMAL LOW (ref 38–126)
Anion gap: 12 (ref 5–15)
BUN: 7 mg/dL (ref 6–20)
CO2: 27 mmol/L (ref 22–32)
Calcium: 9.7 mg/dL (ref 8.9–10.3)
Chloride: 101 mmol/L (ref 98–111)
Creatinine, Ser: 0.66 mg/dL (ref 0.44–1.00)
GFR, Estimated: >60 mL/min (ref >60)
Glucose, Bld: 89 mg/dL (ref 70–99)
Potassium: 3.7 mmol/L (ref 3.5–5.1)
Sodium: 140 mmol/L (ref 135–145)
Total Bilirubin: 0.7 mg/dL (ref 0.3–1.2)
Total Protein: 7.5 g/dL (ref 6.5–8.1)

## 2021-10-30 LAB — PREGNANCY, URINE: Preg Test, Ur: NEGATIVE

## 2021-10-30 MED ORDER — ACETAMINOPHEN 325 MG PO TABS
650.0000 mg | ORAL_TABLET | Freq: Once | ORAL | Status: AC
  Administered 2021-10-30: 650 mg via ORAL
  Filled 2021-10-30: qty 2

## 2021-10-30 MED ORDER — DIPHENHYDRAMINE HCL 50 MG/ML IJ SOLN
25.0000 mg | Freq: Once | INTRAMUSCULAR | Status: AC
  Administered 2021-10-30: 25 mg via INTRAVENOUS
  Filled 2021-10-30: qty 1

## 2021-10-30 MED ORDER — LACTATED RINGERS IV BOLUS
1000.0000 mL | Freq: Once | INTRAVENOUS | Status: AC
  Administered 2021-10-30: 1000 mL via INTRAVENOUS

## 2021-10-30 MED ORDER — METOCLOPRAMIDE HCL 5 MG/ML IJ SOLN
10.0000 mg | Freq: Once | INTRAMUSCULAR | Status: AC
  Administered 2021-10-30: 10 mg via INTRAVENOUS
  Filled 2021-10-30: qty 2

## 2021-10-30 NOTE — ED Notes (Signed)
Dc instructions reviewed with patient. Patient voiced understanding. Dc with belongings.  °

## 2021-10-30 NOTE — ED Provider Notes (Signed)
Asked to evaluate the patient in triage due to headache and subjective vision changes.  Does not have focal neurologic deficits on exam but is complaining of subjectively blurry vision.  Visual acuity was checked and patient has normal vision out of both eyes.  Will continue the patient's evaluation when roomed.   Fransico Meadow, MD 10/30/21 1051

## 2021-10-30 NOTE — Discharge Instructions (Signed)
Today you were seen in the emergency department for your migraine.    In the emergency department you had a neurologic evaluation that was reassuring and were given Reglan, Benadryl, and IV fluids which improved your headache.    At home, please take Tylenol and Motrin for your headache.  Be sure to stay well-hydrated to ensure you do not get dehydrated from your diarrhea.  Check your MyChart online for the results of any tests that had not resulted by the time you left the emergency department.   Follow-up with your primary doctor in 2-3 days regarding your visit.    Return immediately to the emergency department if you experience any of the following: Worsening pain, vision changes, numbness or weakness of your arms or legs, or any other concerning symptoms.    Thank you for visiting our Emergency Department. It was a pleasure taking care of you today.

## 2021-10-30 NOTE — ED Notes (Signed)
ED Provider at bedside. 

## 2021-10-30 NOTE — ED Triage Notes (Signed)
Pt arrived POV. Pt caox4 and ambulatory. Pt c/o headache on the right side of the head from the neck to the to the face that has been going on for approx 3 days and is a dull ache but at times will become sharp and severe. Pt states one hour ago she was driving and had an onset of intermittent blurry vision in the R. Eye with a "weird sensation" under her right eye.

## 2021-10-30 NOTE — ED Provider Notes (Signed)
[facility] Provider Note   CSN: [id number] Arrival date & time: 10/30/21  1016     History Chief Complaint  Patient presents with   Headache    Elizabeth Garrison is a 36 y.o. female.  36 year old female with a history of generalized anxiety, OCD, and asthma who presents emergency department with headache.  Patient reports that she was on vacation at [place] with friends last week.  Says that she went out drinking on Friday night and woke up Saturday and felt hung over.  Says that she had a gradual onset headache on the right side of her head afterwards.  Says that her headache persisted and today felt blurry vision out of her right eye as well.  Denies any fevers.  Has had diarrhea along with several other members of her trip that started on Saturday.  Also had nausea and vomiting on Saturday but vomiting has improved.  No neck stiffness.  Is not on blood thinners.  Tried taking tramadol as well as Xanax and other over-the-counter medications for symptoms with only mild improvement.       Home Medications Prior to Admission medications   Medication Sig Start Date End Date Taking? Authorizing Provider  albuterol (VENTOLIN HFA) 108 (90 Base) MCG/ACT inhaler Inhale into the lungs as needed.    [provider]  ALPRAZolam Duanne Moron) 0.5 MG tablet Take 1 tablet (0.5 mg total) by mouth 2 (two) times daily as needed for anxiety (Panic attack). 04/20/20   Mozingo, Berdie Ogren, NP  buPROPion (WELLBUTRIN XL) 150 MG 24 hr tablet Take 1 tablet (150 mg total) by mouth daily. 04/10/21   Mozingo, Berdie Ogren, NP  sertraline (ZOLOFT) 100 MG tablet Take 1.5 tablets (150 mg total) by mouth at bedtime. 04/10/21   Mozingo, Berdie Ogren, NP      Allergies    Patient has no known allergies.    Review of Systems   Review of Systems  Physical Exam Updated Vital Signs BP 110/80   Pulse 61   Temp 98.7 F (37.1 C)   Resp 18   Ht 5\' 3"  (1.6 m)   Wt 57.6 kg    LMP 10/17/2021 (Exact Date)   SpO2 100%   BMI 22.50 kg/m  Physical Exam Vitals and nursing note reviewed.  Constitutional:      General: She is not in acute distress.    Appearance: She is well-developed.  HENT:     Head: Normocephalic and atraumatic.     Right Ear: External ear normal.     Left Ear: External ear normal.     Nose: Nose normal.  Eyes:     Extraocular Movements: Extraocular movements intact.     Conjunctiva/sclera: Conjunctivae normal.     Pupils: Pupils are equal, round, and reactive to light.  Cardiovascular:     Rate and Rhythm: Normal rate and regular rhythm.     Heart sounds: No murmur heard. Pulmonary:     Effort: Pulmonary effort is normal. No respiratory distress.     Breath sounds: Normal breath sounds.  Abdominal:     General: Abdomen is flat. There is no distension.     Palpations: Abdomen is soft. There is no mass.     Tenderness: There is no abdominal tenderness. There is no guarding.  Musculoskeletal:        General: No swelling.     Cervical back: Normal range of motion and neck supple.     Right lower leg: No edema.  Left lower leg: No edema.  Skin:    General: Skin is warm and dry.     Capillary Refill: Capillary refill takes less than 2 seconds.  Neurological:     Mental Status: She is alert.     Comments: MENTAL STATUS: AAOx3 CRANIAL NERVES: II: Pupils equal and reactive 4 mm BL, no RAPD, no VF deficits III, IV, VI: EOM intact, no gaze preference or deviation, no nystagmus. V: normal sensation to light touch in V1, V2, and V3 segments bilaterally VII: no facial weakness or asymmetry, no nasolabial fold flattening VIII: normal hearing to speech and finger friction IX, X: normal palatal elevation, no uvular deviation XI: 5/5 head turn and 5/5 shoulder shrug bilaterally XII: midline tongue protrusion MOTOR: 5/5 strength in R shoulder flexion, elbow flexion and extension, and grip strength. 5/5 strength in L shoulder flexion, elbow  flexion and extension, and grip strength.  5/5 strength in R hip and knee flexion, knee extension, ankle plantar and dorsiflexion. 5/5 strength in L hip and knee flexion, knee extension, ankle plantar and dorsiflexion. SENSORY: Normal sensation to light touch in all extremities COORD: Normal finger to nose and heel to shin, no tremor, no dysmetria STATION: normal stance, no truncal ataxia GAIT: Normal   Psychiatric:        Mood and Affect: Mood normal.     ED Results / Procedures / Treatments   Labs (all labs ordered are listed, but only abnormal results are displayed) Labs Reviewed  CBC WITH DIFFERENTIAL/PLATELET - Abnormal; Notable for the following components:      Result Value   WBC 3.6 (*)    All other components within normal limits  COMPREHENSIVE METABOLIC PANEL - Abnormal; Notable for the following components:   Alkaline Phosphatase 37 (*)    All other components within normal limits  PREGNANCY, URINE    EKG EKG Interpretation  Date/Time:  Monday October 30 2021 10:56:54 EDT Ventricular Rate:  68 PR Interval:  132 QRS Duration: 103 QT Interval:  399 QTC Calculation: 425 R Axis:   86 Text Interpretation: Sinus rhythm Nonspecific T abnrm, anterolateral leads Confirmed by Margaretmary Eddy [phone]) on 10/30/2021 11:14:29 AM  Radiology No results found.  Procedures Procedures   Medications Ordered in ED Medications  lactated ringers bolus 1,000 mL (1,000 mLs Intravenous New Bag/Given 10/30/21 1151)  metoCLOPramide (REGLAN) injection 10 mg (10 mg Intravenous Given 10/30/21 1147)  diphenhydrAMINE (BENADRYL) injection 25 mg (25 mg Intravenous Given 10/30/21 1144)  acetaminophen (TYLENOL) tablet 650 mg (650 mg Oral Given 10/30/21 1143)    ED Course/ Medical Decision Making/ A&P                           Medical Decision Making Amount and/or Complexity of Data Reviewed Labs: ordered.  Risk OTC drugs. Prescription drug management.   Elizabeth Garrison is a 36  y.o. female with comorbidities that complicate the patient evaluation including generalized anxiety and asthma who presents with chief complaint of headache.  This patient presents to the ED for concern of complaints listed in HPI, this involves an extensive number of treatment options, and is a complaint that carries with it a high risk of complications and morbidity. Disposition including potential need for admission considered.   Initial Ddx:  Gastroenteritis, alcohol intoxication, migraine, meningitis  MDM:  Feel the patient likely has a migraine due to her combined gastroenteritis and dehydration as well as from her alcohol intake and taking over.  No no fevers or neck stiffness that would be concerning for meningitis.  No signs of trauma that would be concerning for possible ICH and patient is not on blood thinners.  Subjectively can planing of vision changes but visual acuity is WNL today and does not have any visual field cuts so feel this may be an aura related to her migraine.  We will check labs given her diarrhea and give her IV fluids.  Plan:  Labs IV fluids Migraine cocktail  ED Summary/Re-evaluation:  Patient reassessed after migraine cocktail and is feeling much better.  Says that her headache is completely resolved.  No further vision changes or complaints.  Requesting to go home.  Patient was instructed to follow-up with her primary doctor in several days and instructed to take Tylenol and Motrin as needed for her headache.  Dispo: DC Home. Return precautions discussed including, but not limited to, those listed in the AVS. Allowed pt time to ask questions which were answered fully prior to dc.   Records reviewed Care Everywhere The following labs were independently interpreted: Chemistry and show no acute abnormality I personally reviewed and interpreted cardiac monitoring: normal sinus rhythm  I have reviewed the patients home medications and made adjustments as  needed  Final Clinical Impression(s) / ED Diagnoses Final diagnoses:  Other migraine without status migrainosus, not intractable  Traveler's diarrhea    Rx / DC Orders ED Discharge Orders     None         Fransico Meadow, MD 10/31/21 [phone]

## 2021-10-30 NOTE — ED Notes (Signed)
Visual acuity performed, per Dr. Philip Aspen and Cortney - RN. Both were informed of pt's results.

## 2021-11-01 DIAGNOSIS — G4452 New daily persistent headache (NDPH): Secondary | ICD-10-CM | POA: Diagnosis not present

## 2021-11-10 DIAGNOSIS — R509 Fever, unspecified: Secondary | ICD-10-CM | POA: Diagnosis not present

## 2021-11-10 DIAGNOSIS — R070 Pain in throat: Secondary | ICD-10-CM | POA: Diagnosis not present

## 2021-11-10 DIAGNOSIS — B349 Viral infection, unspecified: Secondary | ICD-10-CM | POA: Diagnosis not present

## 2021-12-08 ENCOUNTER — Ambulatory Visit: Payer: BC Managed Care – PPO | Admitting: Adult Health

## 2021-12-08 ENCOUNTER — Encounter: Payer: Self-pay | Admitting: Adult Health

## 2021-12-08 DIAGNOSIS — F422 Mixed obsessional thoughts and acts: Secondary | ICD-10-CM | POA: Diagnosis not present

## 2021-12-08 DIAGNOSIS — F411 Generalized anxiety disorder: Secondary | ICD-10-CM | POA: Diagnosis not present

## 2021-12-08 MED ORDER — SERTRALINE HCL 100 MG PO TABS: 150.0000 mg | ORAL_TABLET | Freq: Every day | ORAL | 3 refills | Status: DC

## 2021-12-08 MED ORDER — ALPRAZOLAM 0.5 MG PO TABS: 0.5000 mg | ORAL_TABLET | Freq: Two times a day (BID) | ORAL | 0 refills | Status: DC | PRN

## 2021-12-08 NOTE — Progress Notes (Signed)
Elizabeth Garrison [id number] Feb 24, 1985 36 y.o.  Subjective:   Patient ID:  Elizabeth Garrison is a 36 y.o. (DOB 1985-06-23) female.  Chief Complaint: No chief complaint on file.   HPI Elizabeth Garrison presents to the office today for follow-up of mixed obsessional thoughts and acts and  GAD.  Describes mood today as "ok". Pleasant. Denies tearfulness. Mood symptoms - denies depression, anxiety and irritability. Reports some worry, rumination, and over thinking - nothing unmanageable. Mood is stable. Stating "I'm doing alight". Feels like medications are helpful. Taking the Zoloft 150mg  daily. Did not start the Wellbutrin. Taking the Xanax as needed. Varying interest and motivation. Taking medications as prescribed. Energy levels stable - has nervous energy. Active, has a regular exercise routine.  Enjoys some usual interests and activities. Married. Lives with husband and 3 children. Spending time with family 3 children 4, 7, and 9. Appetite adequate. Weight loss 20 pounds. Sleeps well most nights. Averages 8 hours - using Xanax as needed for sleel/flight anxiety. Focus and concentration stable. Completing tasks. Managing aspects of household. Works full time - . Denies SI or HI.  Denies AH or VH.  Previous medication trials: Prozac   Flowsheet Row ED from 10/30/2021 in [facility] ED from 08/31/2020 in [facility]  C-SSRS RISK CATEGORY No Risk No Risk        Review of Systems:  Review of Systems  Musculoskeletal:  Negative for gait problem.  Neurological:  Negative for tremors.  Psychiatric/Behavioral:         Please refer to HPI    Medications: I have reviewed the patient's current medications.  Current Outpatient Medications  Medication Sig Dispense Refill   albuterol (VENTOLIN HFA) 108 (90 Base) MCG/ACT inhaler Inhale into the lungs as needed.     ALPRAZolam (XANAX) 0.5 MG tablet Take 1 tablet (0.5 mg total)  by mouth 2 (two) times daily as needed for anxiety (Panic attack). 180 tablet 0   sertraline (ZOLOFT) 100 MG tablet Take 1.5 tablets (150 mg total) by mouth at bedtime. 135 tablet 3   No current facility-administered medications for this visit.    Medication Side Effects: None  Allergies: No Known Allergies  Past Medical History:  Diagnosis Date   Anxiety    as a teen   Asthma 02/08/11   H/O cystitis    H/O pyelonephritis    as adolescent   H/O varicella    History of kidney stones    age 6   Yeast infection     Past Medical History, Surgical history, Social history, and Family history were reviewed and updated as appropriate.   Please see review of systems for further details on the patient's review from today.   Objective:   Physical Exam:  There were no vitals taken for this visit.  Physical Exam Constitutional:      General: She is not in acute distress. Musculoskeletal:        General: No deformity.  Neurological:     Mental Status: She is alert and oriented to person, place, and time.     Coordination: Coordination normal.  Psychiatric:        Attention and Perception: Attention and perception normal. She does not perceive auditory or visual hallucinations.        Mood and Affect: Mood normal. Mood is not anxious or depressed. Affect is not labile, blunt, angry or inappropriate.        Speech: Speech normal.  Behavior: Behavior normal.        Thought Content: Thought content normal. Thought content is not paranoid or delusional. Thought content does not include homicidal or suicidal ideation. Thought content does not include homicidal or suicidal plan.        Cognition and Memory: Cognition and memory normal.        Judgment: Judgment normal.     Comments: Insight intact     Lab Review:     Component Value Date/Time   NA 140 10/30/2021 1107   K 3.7 10/30/2021 1107   CL 101 10/30/2021 1107   CO2 27 10/30/2021 1107   GLUCOSE 89 10/30/2021 1107    BUN 7 10/30/2021 1107   CREATININE 0.66 10/30/2021 1107   CALCIUM 9.7 10/30/2021 1107   PROT 7.5 10/30/2021 1107   PROT 7.0 04/04/2021 0950   ALBUMIN 4.8 10/30/2021 1107   ALBUMIN 4.9 (H) 04/04/2021 0950   AST 26 10/30/2021 1107   ALT 19 10/30/2021 1107   ALKPHOS 37 (L) 10/30/2021 1107   BILITOT 0.7 10/30/2021 1107   BILITOT 0.6 04/04/2021 0950   GFRNONAA >60 10/30/2021 1107   GFRAA >60 10/12/2016 1156       Component Value Date/Time   WBC 3.6 (L) 10/30/2021 1107   RBC 4.60 10/30/2021 1107   HGB 13.8 10/30/2021 1107   HCT 39.7 10/30/2021 1107   PLT 160 10/30/2021 1107   MCV 86.3 10/30/2021 1107   MCH 30.0 10/30/2021 1107   MCHC 34.8 10/30/2021 1107   RDW 11.9 10/30/2021 1107   LYMPHSABS 1.4 10/30/2021 1107   MONOABS 0.4 10/30/2021 1107   EOSABS 0.1 10/30/2021 1107   BASOSABS 0.0 10/30/2021 1107    No results found for: "POCLITH", "LITHIUM"   No results found for: "PHENYTOIN", "PHENOBARB", "VALPROATE", "CBMZ"   .res Assessment: Plan:    Plan:  PDMP reviewed  1. Zoloft 150mg  daily 2. Xanax 0.5mg  BID as needed - last filled 08/22  D/C Wellbutrin XL 150mg  every morning - never started  RTC 1 year  Patient advised to contact office with any questions, adverse effects, or acute worsening in signs and symptoms.  Time spent with patient was 20 minutes. Greater than 50% of face to face time with patient was spent on counseling and coordination of care.  Diagnoses and all orders for this visit:  Generalized anxiety disorder -     ALPRAZolam (XANAX) 0.5 MG tablet; Take 1 tablet (0.5 mg total) by mouth 2 (two) times daily as needed for anxiety (Panic attack).  Mixed obsessional thoughts and acts -     sertraline (ZOLOFT) 100 MG tablet; Take 1.5 tablets (150 mg total) by mouth at bedtime.     Please see After Visit Summary for patient specific instructions.  Future Appointments  Date Time Provider Department Center  12/10/2022  8:40 AM Yesli Vanderhoff, ,  NP [facility] None    No orders of the defined types were placed in this encounter.   -------------------------------

## 2021-12-25 DIAGNOSIS — M6281 Muscle weakness (generalized): Secondary | ICD-10-CM | POA: Diagnosis not present

## 2021-12-25 DIAGNOSIS — M542 Cervicalgia: Secondary | ICD-10-CM | POA: Diagnosis not present

## 2021-12-25 DIAGNOSIS — M25511 Pain in right shoulder: Secondary | ICD-10-CM | POA: Diagnosis not present

## 2022-01-15 DIAGNOSIS — M47816 Spondylosis without myelopathy or radiculopathy, lumbar region: Secondary | ICD-10-CM | POA: Diagnosis not present

## 2022-01-15 DIAGNOSIS — M47812 Spondylosis without myelopathy or radiculopathy, cervical region: Secondary | ICD-10-CM | POA: Diagnosis not present

## 2022-02-12 DIAGNOSIS — M47812 Spondylosis without myelopathy or radiculopathy, cervical region: Secondary | ICD-10-CM | POA: Diagnosis not present

## 2022-02-12 DIAGNOSIS — M47816 Spondylosis without myelopathy or radiculopathy, lumbar region: Secondary | ICD-10-CM | POA: Diagnosis not present

## 2022-04-03 ENCOUNTER — Other Ambulatory Visit (HOSPITAL_COMMUNITY): Payer: Self-pay | Admitting: Obstetrics & Gynecology

## 2022-04-03 DIAGNOSIS — N6324 Unspecified lump in the left breast, lower inner quadrant: Secondary | ICD-10-CM

## 2022-04-16 ENCOUNTER — Ambulatory Visit: Payer: BC Managed Care – PPO | Admitting: Internal Medicine

## 2022-04-16 NOTE — Progress Notes (Deleted)
Cardiology Office Note:    Date:  04/16/2022   ID:  ALAMEDA MOLINO, DOB [birth year], MRN [id number]  PCP:  Trisha Mangle, FNP  [facility] Cardiologist:  None  [facility] Electrophysiologist:  None   CC: FH f/u  History of Present Illness:    Elizabeth Garrison is a 37 y.o. female with a hx of OCD and Depression, Asthma, prior COVID infection. Seen in 2022 for FH evaluation. 2023: Discussed how family dynamics affect her FH therapy eval.  Patient notes that she is doing ***.   Since last visit notes *** . There are no*** interval hospital/ED visit.    No chest pain or pressure ***.  No SOB/DOE*** and no PND/Orthopnea***.  No weight gain or leg swelling***.  No palpitations or syncope ***.  Ambulatory blood pressure ***.    Past Medical History:  Diagnosis Date   Anxiety    as a teen   Asthma 02/08/11   H/O cystitis    H/O pyelonephritis    as adolescent   H/O varicella    History of kidney stones    age 14   Yeast infection     Past Surgical History:  Procedure Laterality Date   CESAREAN SECTION N/A 10/18/2016   Procedure: Primary CESAREAN SECTION;  Surgeon: Shea Evans, MD;  Location: [facility] BIRTHING SUITES;  Service: Obstetrics;  Laterality: N/A;  EDD: 10/25/16   TONSILLECTOMY     age 82   WISDOM TOOTH EXTRACTION  age 57    Current Medications: No outpatient medications have been marked as taking for the 04/16/22 encounter (Appointment) with Christell Constant, MD.     Allergies:   Patient has no known allergies.   Social History   Socioeconomic History   Marital status: Married    Spouse name: Not on file   Number of children: Not on file   Years of education: Not on file   Highest education level: Not on file  Occupational History   Not on file  Tobacco Use   Smoking status: Never   Smokeless tobacco: Never  Vaping Use   Vaping Use: Never used  Substance and Sexual Activity   Alcohol use: No   Drug use: No   Sexual activity: Yes     Birth control/protection: None  Other Topics Concern   Not on file  Social History Narrative   Not on file   Social Determinants of Health   Financial Resource Strain: Not on file  Food Insecurity: Not on file  Transportation Needs: Not on file  Physical Activity: Not on file  Stress: Not on file  Social Connections: Not on file    Family History: The patient's family history includes Asthma in her brother and mother; Cancer in her maternal grandfather and paternal grandmother; Hypertension in her maternal grandmother, maternal uncle, and mother; Other in her mother; Thyroid disease in her maternal uncle and mother. Prior Pacemakers in the past.; has high cholesterol.   ROS:   Please see the history of present illness.    All other systems reviewed and are negative.  EKGs/Labs/Other Studies Reviewed:    The following studies were reviewed today:  EKG:  EKG is ordered today.   04/06/21: Sinus bradycardia rate 59 2022:SR rate 68 without ST/T changes.  Recent Labs: 10/30/2021: ALT 19; BUN 7; Creatinine, Ser 0.66; Hemoglobin 13.8; Platelets 160; Potassium 3.7; Sodium 140  Recent Lipid Panel    Component Value Date/Time   CHOL 250 (H) 04/04/2021 3013  TRIG 84 04/04/2021 0950   HDL 53 04/04/2021 0950   CHOLHDL 4.7 (H) 04/04/2021 0950   LDLCALC 183 (H) 04/04/2021 0950    Physical Exam:    VS:  There were no vitals taken for this visit.    Wt Readings from Last 3 Encounters:  10/30/21 127 lb (57.6 kg)  04/06/21 128 lb (58.1 kg)  08/31/20 140 lb (63.5 kg)    Gen: No distress   Neck: No JVD Cardiac: No Rubs or Gallops, no Murmur, regular bradycardia, +2 radial pulses Respiratory: Clear to auscultation bilaterally, normal effort, normal  respiratory rate GI: Soft, nontender, non-distended  MS: No  edema;  moves all extremities Integument: Skin feels warm Neuro:  At time of evaluation, alert and oriented to person/place/time/situation  Psych: Normal affect, patient  feels OK   ASSESSMENT:    No diagnosis found.   PLAN:    Familial Hyperlipidemia -LDL goal less than 100 - at next blood draw, will get Lp(a) - Meets indication for high intensity statin - we have discussed at length the pros and cons of statins, PCSK9i, and inclisiran, she will review the data nad we will make a shared decision based on results - at 42 we may discuss CAC CT       Shared Decision Making/Informed Consent      Patient amenable to plan  Medication Adjustments/Labs and Tests Ordered: Current medicines are reviewed at length with the patient today.  Concerns regarding medicines are outlined above.  No orders of the defined types were placed in this encounter.  No orders of the defined types were placed in this encounter.   There are no Patient Instructions on file for this visit.   Signed, Christell Constant, MD  04/16/2022 9:39 AM    [facility]

## 2022-06-05 ENCOUNTER — Telehealth: Payer: Self-pay | Admitting: Internal Medicine

## 2022-06-05 DIAGNOSIS — E7801 Familial hypercholesterolemia: Secondary | ICD-10-CM

## 2022-06-05 NOTE — Telephone Encounter (Signed)
Patient called and said that she would like to get lab work done before her appt on 06/14/22. Especially her lipids lab work

## 2022-06-06 NOTE — Telephone Encounter (Signed)
Voicemail box full unable to leave a message 

## 2022-06-08 NOTE — Telephone Encounter (Signed)
Called pt to set up lab appointment.  Pt will have FLP, Lpa on 06/12/22.

## 2022-06-12 ENCOUNTER — Ambulatory Visit: Payer: BC Managed Care – PPO | Attending: Internal Medicine

## 2022-06-12 DIAGNOSIS — E7801 Familial hypercholesterolemia: Secondary | ICD-10-CM

## 2022-06-12 LAB — LIPOPROTEIN A (LPA)

## 2022-06-12 LAB — LIPID PANEL
Cholesterol, Total: 229 mg/dL — ABNORMAL HIGH (ref 100–199)
VLDL Cholesterol Cal: 20 mg/dL (ref 5–40)

## 2022-06-13 LAB — LIPID PANEL
Chol/HDL Ratio: 4 ratio (ref 0.0–4.4)
HDL: 57 mg/dL (ref >39)
LDL Chol Calc (NIH): 152 mg/dL — ABNORMAL HIGH (ref 0–99)
Triglycerides: 111 mg/dL (ref 0–149)

## 2022-06-13 NOTE — Progress Notes (Unsigned)
Cardiology Office Note:    Date:  06/14/2022   ID:  Elizabeth Garrison, DOB [birth year], MRN [id number]  PCP:  Trisha Mangle, FNP  [facility] Cardiologist:  None  [facility] Electrophysiologist:  None   CC: FH f/u  History of Present Illness:    Elizabeth Garrison is a 37 y.o. female with a hx of OCD and Depression, Asthma, prior COVID infection. Seen in 2022 for FH evaluation. 2023: did not want to try medication therapy.  Patient notes that she is doing well.   Since last visit notes that she has lost weight with improvement in her cholesterol . There are no interval hospital/ED visit.    No chest pain or pressure .  No SOB/DOE and no PND/Orthopnea.  No weight gain or leg swelling.  No palpitations or syncope.  She has picked up her statin prescription.  She has not opened it yet.  She has questions about: - Lp(a) - Leqvio - red yeast rice extract - CAC score - vegan diet    Past Medical History:  Diagnosis Date   Anxiety    as a teen   Asthma 02/08/11   H/O cystitis    H/O pyelonephritis    as adolescent   H/O varicella    History of kidney stones    age 68   Yeast infection     Past Surgical History:  Procedure Laterality Date   CESAREAN SECTION N/A 10/18/2016   Procedure: Primary CESAREAN SECTION;  Surgeon: Shea Evans, MD;  Location: [facility] BIRTHING SUITES;  Service: Obstetrics;  Laterality: N/A;  EDD: 10/25/16   TONSILLECTOMY     age 10   WISDOM TOOTH EXTRACTION  age 41    Current Medications: Current Meds  Medication Sig   albuterol (VENTOLIN HFA) 108 (90 Base) MCG/ACT inhaler Inhale into the lungs as needed.   ALPRAZolam (XANAX) 0.5 MG tablet Take 1 tablet (0.5 mg total) by mouth 2 (two) times daily as needed for anxiety (Panic attack).   sertraline (ZOLOFT) 100 MG tablet Take 1.5 tablets (150 mg total) by mouth at bedtime.     Allergies:   Patient has no known allergies.   Social History   Socioeconomic History   Marital status: Married     Spouse name: Not on file   Number of children: Not on file   Years of education: Not on file   Highest education level: Not on file  Occupational History   Not on file  Tobacco Use   Smoking status: Never   Smokeless tobacco: Never  Vaping Use   Vaping Use: Never used  Substance and Sexual Activity   Alcohol use: No   Drug use: No   Sexual activity: Yes    Birth control/protection: None  Other Topics Concern   Not on file  Social History Narrative   Not on file   Social Determinants of Health   Financial Resource Strain: Not on file  Food Insecurity: Not on file  Transportation Needs: Not on file  Physical Activity: Not on file  Stress: Not on file  Social Connections: Not on file    Family History: The patient's family history includes Asthma in her brother and mother; Cancer in her maternal grandfather and paternal grandmother; Hypertension in her maternal grandmother, maternal uncle, and mother; Other in her mother; Thyroid disease in her maternal uncle and mother. Prior Pacemakers in the past.; has high cholesterol.   EKGs/Labs/Other Studies Reviewed:    The following studies  were reviewed today:  EKG:   04/06/21: Sinus bradycardia rate 59 2022:SR rate 68 without ST/T changes.  Recent Labs: 10/30/2021: ALT 19; BUN 7; Creatinine, Ser 0.66; Hemoglobin 13.8; Platelets 160; Potassium 3.7; Sodium 140  Recent Lipid Panel    Component Value Date/Time   CHOL 229 (H) 06/12/2022 0827   TRIG 111 06/12/2022 0827   HDL 57 06/12/2022 0827   CHOLHDL 4.0 06/12/2022 0827   LDLCALC 152 (H) 06/12/2022 0827    Physical Exam:    VS:  BP 124/80   Pulse 80   Ht 5\' 3"  (1.6 m)   Wt 128 lb 6.4 oz (58.2 kg)   SpO2 98%   BMI 22.75 kg/m     Wt Readings from Last 3 Encounters:  06/14/22 128 lb 6.4 oz (58.2 kg)  10/30/21 127 lb (57.6 kg)  04/06/21 128 lb (58.1 kg)    Gen: No distress   Neck: No JVD Cardiac: No Rubs or Gallops, no Murmur, regular bradycardia, +2 radial  pulses Respiratory: Clear to auscultation bilaterally, normal effort, normal  respiratory rate GI: Soft, nontender, non-distended  MS: No  edema;  moves all extremities Integument: Skin feels warm Neuro:  At time of evaluation, alert and oriented to person/place/time/situation  Psych: Normal affect, patient feels OK   ASSESSMENT:    1. Familial hypercholesterolemia   2. Family history of early CAD   3. Hypertriglyceridemia     PLAN:    Familial Hyperlipidemia Family history of early CAD Elevated TGs - LDL goal less than 100 - normal Lp(a) - LDL is still above goal with weight loss - she would like to do CAC score, though I would normal weight until 42 we will try this - we will get Lipids and hs-CRP in three months - We will reach out to see if Wilber Bihari is covered for her insurance - discussed vegan diet using the works of Dr. Caryl Never as a scaffold  Diet Prescription Type: Vegan Weight loss goal if applicable: NA Limitations: has concerns about protein  Meal plan: Created one day meal plan and gave online resources for weekly planning; patient is in consideration to diet created  One year with me  Medication Adjustments/Labs and Tests Ordered: Current medicines are reviewed at length with the patient today.  Concerns regarding medicines are outlined above.  Orders Placed This Encounter  Procedures   CT CARDIAC SCORING (SELF PAY ONLY)   Lipid panel   High sensitivity CRP   No orders of the defined types were placed in this encounter.   Patient Instructions  Medication Instructions:  Your physician recommends that you continue on your current medications as directed. Please refer to the Current Medication list given to you today.  *If you need a refill on your cardiac medications before your next appointment, please call your pharmacy*   Lab Work: IN 3 MONTHS: fasting lipid panel, CRP  If you have labs (blood work) drawn today and your tests are completely  normal, you will receive your results only by: MyChart Message (if you have MyChart) OR A paper copy in the mail If you have any lab test that is abnormal or we need to change your treatment, we will call you to review the results.   Testing/Procedures: Your physician has requested that you have a Calcium score test.   Follow-Up: At [facility], you and your health needs are our priority.  As part of our continuing mission to provide you with exceptional heart care, we  have created designated Provider Care Teams.  These Care Teams include your primary Cardiologist (physician) and Advanced Practice Providers (APPs -  Physician Assistants and Nurse Practitioners) who all work together to provide you with the care you need, when you need it.  We recommend signing up for the patient portal called "MyChart".  Sign up information is provided on this After Visit Summary.  MyChart is used to connect with patients for Virtual Visits (Telemedicine).  Patients are able to view lab/test results, encounter notes, upcoming appointments, etc.  Non-urgent messages can be sent to your provider as well.   To learn more about what you can do with MyChart, go to [email or web address].    Your next appointment:   1 year(s)  Provider:   Riley Lam, MD       Signed, Christell Constant, MD  06/14/2022 5:06 PM    [facility]

## 2022-06-14 ENCOUNTER — Ambulatory Visit: Payer: BC Managed Care – PPO | Attending: Internal Medicine | Admitting: Internal Medicine

## 2022-06-14 ENCOUNTER — Encounter: Payer: Self-pay | Admitting: Internal Medicine

## 2022-06-14 VITALS — BP 124/80 | HR 80 | Ht 63.0 in | Wt 128.4 lb

## 2022-06-14 DIAGNOSIS — E781 Pure hyperglyceridemia: Secondary | ICD-10-CM | POA: Diagnosis not present

## 2022-06-14 DIAGNOSIS — Z8249 Family history of ischemic heart disease and other diseases of the circulatory system: Secondary | ICD-10-CM

## 2022-06-14 DIAGNOSIS — E7801 Familial hypercholesterolemia: Secondary | ICD-10-CM

## 2022-06-14 NOTE — Patient Instructions (Signed)
Medication Instructions:  Your physician recommends that you continue on your current medications as directed. Please refer to the Current Medication list given to you today.  *If you need a refill on your cardiac medications before your next appointment, please call your pharmacy*   Lab Work: IN 3 MONTHS: fasting lipid panel, CRP  If you have labs (blood work) drawn today and your tests are completely normal, you will receive your results only by: MyChart Message (if you have MyChart) OR A paper copy in the mail If you have any lab test that is abnormal or we need to change your treatment, we will call you to review the results.   Testing/Procedures: Your physician has requested that you have a Calcium score test.   Follow-Up: At [facility], you and your health needs are our priority.  As part of our continuing mission to provide you with exceptional heart care, we have created designated Provider Care Teams.  These Care Teams include your primary Cardiologist (physician) and Advanced Practice Providers (APPs -  Physician Assistants and Nurse Practitioners) who all work together to provide you with the care you need, when you need it.  We recommend signing up for the patient portal called "MyChart".  Sign up information is provided on this After Visit Summary.  MyChart is used to connect with patients for Virtual Visits (Telemedicine).  Patients are able to view lab/test results, encounter notes, upcoming appointments, etc.  Non-urgent messages can be sent to your provider as well.   To learn more about what you can do with MyChart, go to [email or web address].    Your next appointment:   1 year(s)  Provider:   Riley Lam, MD

## 2022-06-29 ENCOUNTER — Other Ambulatory Visit (HOSPITAL_COMMUNITY): Payer: BC Managed Care – PPO

## 2022-07-02 ENCOUNTER — Encounter: Payer: Self-pay | Admitting: Internal Medicine

## 2022-07-05 ENCOUNTER — Ambulatory Visit (HOSPITAL_BASED_OUTPATIENT_CLINIC_OR_DEPARTMENT_OTHER)
Admission: RE | Admit: 2022-07-05 | Discharge: 2022-07-05 | Disposition: A | Discharge status: Home / Self Care | Payer: BC Managed Care – PPO | Source: Ambulatory Visit | Attending: Internal Medicine | Admitting: Internal Medicine

## 2022-07-05 DIAGNOSIS — E7801 Familial hypercholesterolemia: Secondary | ICD-10-CM | POA: Insufficient documentation

## 2022-07-10 MED ORDER — ROSUVASTATIN CALCIUM 5 MG PO TABS: 5.0000 mg | ORAL_TABLET | Freq: Every day | ORAL | 3 refills | Status: AC

## 2022-07-10 NOTE — Telephone Encounter (Signed)
The patient has been notified of the result and verbalized understanding.  All questions (if any) were answered. Corley Kohls N Areyanna Figeroa, RN 07/10/2022 10:13 AM   Pt is agreeable to trial rosuvastatin 5 mg PO every day.  If can not tolerate will notify our office.  Order placed.

## 2022-07-10 NOTE — Telephone Encounter (Signed)
-----   Message from Christell Constant, MD sent at 07/05/2022  2:44 PM EDT ----- Zero Calcium score. My recommendation is to tart rosuvastatin 5 mg PO daily If she is not amenable, will offer her Pharm D clinic to see if she would be covered for PCSK9i under the Familial Hypercholesterolemia indication  Christell Constant, MD

## 2022-08-01 DIAGNOSIS — L814 Other melanin hyperpigmentation: Secondary | ICD-10-CM | POA: Diagnosis not present

## 2022-08-01 DIAGNOSIS — H57813 Brow ptosis, bilateral: Secondary | ICD-10-CM | POA: Diagnosis not present

## 2022-08-01 DIAGNOSIS — H0279 Other degenerative disorders of eyelid and periocular area: Secondary | ICD-10-CM | POA: Diagnosis not present

## 2022-08-01 DIAGNOSIS — L0291 Cutaneous abscess, unspecified: Secondary | ICD-10-CM | POA: Diagnosis not present

## 2022-08-28 DIAGNOSIS — D2271 Melanocytic nevi of right lower limb, including hip: Secondary | ICD-10-CM | POA: Diagnosis not present

## 2022-08-28 DIAGNOSIS — D2272 Melanocytic nevi of left lower limb, including hip: Secondary | ICD-10-CM | POA: Diagnosis not present

## 2022-08-28 DIAGNOSIS — I788 Other diseases of capillaries: Secondary | ICD-10-CM | POA: Diagnosis not present

## 2022-08-28 DIAGNOSIS — D225 Melanocytic nevi of trunk: Secondary | ICD-10-CM | POA: Diagnosis not present

## 2022-09-12 ENCOUNTER — Ambulatory Visit: Payer: BC Managed Care – PPO | Attending: Internal Medicine

## 2022-12-10 ENCOUNTER — Ambulatory Visit (INDEPENDENT_AMBULATORY_CARE_PROVIDER_SITE_OTHER): Payer: Self-pay | Admitting: Adult Health

## 2022-12-10 DIAGNOSIS — Z0389 Encounter for observation for other suspected diseases and conditions ruled out: Secondary | ICD-10-CM

## 2022-12-10 NOTE — Progress Notes (Signed)
Patient no show appointment. ? ?

## 2023-01-16 ENCOUNTER — Ambulatory Visit (INDEPENDENT_AMBULATORY_CARE_PROVIDER_SITE_OTHER): Payer: BC Managed Care – PPO | Admitting: Adult Health

## 2023-01-16 ENCOUNTER — Encounter: Payer: Self-pay | Admitting: Adult Health

## 2023-01-16 DIAGNOSIS — F411 Generalized anxiety disorder: Secondary | ICD-10-CM

## 2023-01-16 DIAGNOSIS — F422 Mixed obsessional thoughts and acts: Secondary | ICD-10-CM

## 2023-01-16 MED ORDER — ALPRAZOLAM 0.5 MG PO TABS
0.5000 mg | ORAL_TABLET | Freq: Every day | ORAL | 0 refills | Status: DC | PRN
Start: 2023-01-16 — End: 2023-10-22

## 2023-01-16 MED ORDER — SERTRALINE HCL 100 MG PO TABS: 150.0000 mg | ORAL_TABLET | Freq: Every day | ORAL | 3 refills | Status: DC

## 2023-01-16 NOTE — Progress Notes (Signed)
 [name] [id number] [birth year] 37 y.o.  Subjective:   Patient ID:  Elizabeth Garrison is a 38 y.o. (DOB 1985/12/03) female.  Chief Complaint: No chief complaint on file.   HPI LYNNETT LANGLINAIS presents to the office today for follow-up of mixed obsessional thoughts and acts and GAD.  Describes mood today as ok. Pleasant. Denies tearfulness. Mood symptoms - reports some depression, anxiety and irritability. Reports a few panic attacks. Reports some worry, rumination, and over thinking. Reports grieving the loss of a friend. Mood is stable. Stating I feel like I'm doing alright. Feels like current medication regimen works well. Varying interest and motivation. Taking medications as prescribed. Energy levels stable. Active, has a regular exercise routine.  Enjoys some usual interests and activities. Married. Lives with husband and 3 children. Spending time with family. Appetite adequate. Weight stable. Sleeps well most nights. Averages 8 hours. Focus and concentration stable. Completing tasks. Managing aspects of household. Works full time - psychologist, sport and exercise. Denies SI or HI.  Denies AH or VH. Denies self harm. Denies substance use.  Previous medication trials: Prozac   Flowsheet Row ED from 10/30/2021 in [facility] Emergency Department at [facility] ED from 08/31/2020 in [facility] Emergency Department at [facility]  C-SSRS RISK CATEGORY No Risk No Risk        Review of Systems:  Review of Systems  Musculoskeletal:  Negative for gait problem.  Neurological:  Negative for tremors.  Psychiatric/Behavioral:         Please refer to HPI    Medications: I have reviewed the patient's current medications.  Current Outpatient Medications  Medication Sig Dispense Refill   rosuvastatin  (CRESTOR ) 5 MG tablet Take 1 tablet (5 mg total) by mouth daily. 90 tablet 3   albuterol (VENTOLIN HFA) 108 (90 Base) MCG/ACT inhaler Inhale into the lungs as needed.      ALPRAZolam  (XANAX ) 0.5 MG tablet Take 1 tablet (0.5 mg total) by mouth 2 (two) times daily as needed for anxiety (Panic attack). 180 tablet 0   sertraline  (ZOLOFT ) 100 MG tablet Take 1.5 tablets (150 mg total) by mouth at bedtime. 135 tablet 3   No current facility-administered medications for this visit.    Medication Side Effects: None  Allergies: No Known Allergies  Past Medical History:  Diagnosis Date   Anxiety    as a teen   Asthma 02/08/11   H/O cystitis    H/O pyelonephritis    as adolescent   H/O varicella    History of kidney stones    age 56   Yeast infection     Past Medical History, Surgical history, Social history, and Family history were reviewed and updated as appropriate.   Please see review of systems for further details on the patient's review from today.   Objective:   Physical Exam:  There were no vitals taken for this visit.  Physical Exam Constitutional:      General: She is not in acute distress. Musculoskeletal:        General: No deformity.  Neurological:     Mental Status: She is alert and oriented to person, place, and time.     Coordination: Coordination normal.  Psychiatric:        Attention and Perception: Attention and perception normal. She does not perceive auditory or visual hallucinations.        Mood and Affect: Mood normal. Mood is not anxious or depressed. Affect is not labile, blunt, angry or inappropriate.  Speech: Speech normal.        Behavior: Behavior normal.        Thought Content: Thought content normal. Thought content is not paranoid or delusional. Thought content does not include homicidal or suicidal ideation. Thought content does not include homicidal or suicidal plan.        Cognition and Memory: Cognition and memory normal.        Judgment: Judgment normal.     Comments: Insight intact     Lab Review:     Component Value Date/Time   NA 140 10/30/2021 1107   K 3.7 10/30/2021 1107   CL 101 10/30/2021  1107   CO2 27 10/30/2021 1107   GLUCOSE 89 10/30/2021 1107   BUN 7 10/30/2021 1107   CREATININE 0.66 10/30/2021 1107   CALCIUM  9.7 10/30/2021 1107   PROT 7.5 10/30/2021 1107   PROT 7.0 04/04/2021 0950   ALBUMIN 4.8 10/30/2021 1107   ALBUMIN 4.9 (H) 04/04/2021 0950   AST 26 10/30/2021 1107   ALT 19 10/30/2021 1107   ALKPHOS 37 (L) 10/30/2021 1107   BILITOT 0.7 10/30/2021 1107   BILITOT 0.6 04/04/2021 0950   GFRNONAA >60 10/30/2021 1107   GFRAA >60 10/12/2016 1156       Component Value Date/Time   WBC 3.6 (L) 10/30/2021 1107   RBC 4.60 10/30/2021 1107   HGB 13.8 10/30/2021 1107   HCT 39.7 10/30/2021 1107   PLT 160 10/30/2021 1107   MCV 86.3 10/30/2021 1107   MCH 30.0 10/30/2021 1107   MCHC 34.8 10/30/2021 1107   RDW 11.9 10/30/2021 1107   LYMPHSABS 1.4 10/30/2021 1107   MONOABS 0.4 10/30/2021 1107   EOSABS 0.1 10/30/2021 1107   BASOSABS 0.0 10/30/2021 1107    No results found for: POCLITH, LITHIUM   No results found for: PHENYTOIN, PHENOBARB, VALPROATE, CBMZ   .res Assessment: Plan:     Plan:  PDMP reviewed  1. Zoloft  150mg  daily 2. Xanax  0.5mg  BID as needed - last filled 08/22  RTC 1 year  Patient advised to contact office with any questions, adverse effects, or acute worsening in signs and symptoms.  Time spent with patient was 20 minutes. Greater than 50% of face to face time with patient was spent on counseling and coordination of care. There are no diagnoses linked to this encounter.   Please see After Visit Summary for patient specific instructions.  No future appointments.  No orders of the defined types were placed in this encounter.   -------------------------------

## 2023-03-07 ENCOUNTER — Encounter: Payer: Self-pay | Admitting: Professional Counselor

## 2023-03-07 ENCOUNTER — Ambulatory Visit (INDEPENDENT_AMBULATORY_CARE_PROVIDER_SITE_OTHER): Payer: BC Managed Care – PPO | Admitting: Professional Counselor

## 2023-03-07 DIAGNOSIS — F422 Mixed obsessional thoughts and acts: Secondary | ICD-10-CM

## 2023-03-07 DIAGNOSIS — F331 Major depressive disorder, recurrent, moderate: Secondary | ICD-10-CM

## 2023-03-07 DIAGNOSIS — F411 Generalized anxiety disorder: Secondary | ICD-10-CM
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
## 2023-04-24 ENCOUNTER — Ambulatory Visit: Payer: BC Managed Care – PPO | Admitting: Professional Counselor

## 2023-05-13 ENCOUNTER — Ambulatory Visit: Payer: BC Managed Care – PPO | Admitting: Professional Counselor

## 2023-05-14 ENCOUNTER — Ambulatory Visit: Admitting: Professional Counselor

## 2023-05-14 ENCOUNTER — Encounter: Payer: Self-pay | Admitting: Professional Counselor

## 2023-05-14 DIAGNOSIS — F331 Major depressive disorder, recurrent, moderate: Secondary | ICD-10-CM

## 2023-05-14 DIAGNOSIS — F422 Mixed obsessional thoughts and acts: Secondary | ICD-10-CM | POA: Diagnosis not present

## 2023-05-14 DIAGNOSIS — F411 Generalized anxiety disorder: Secondary | ICD-10-CM | POA: Diagnosis not present

## 2023-05-14 NOTE — Progress Notes (Signed)
 [facility] Counselor/Therapist Progress Note  Patient ID: Elizabeth Garrison, MRN: [id number],    Date: 06/24/2023  Time Spent: 2:14 PM to 3:20 PM  Treatment Type: Individual Therapy  Reported Symptoms: Sense of overwhelm, fatigue, worries, restlessness, stress, grief/loss, intermittent low mood, interpersonal concerns, intrusive preoccupying thoughts including aggressive obsessions such as fear of inadvertently doing harm, contamination of sessions such as relates environmental contaminants, residues and bodily fluids, miscellaneous obsessions such as need to know remember and intrusive nonsense, and somatic obsessions such as concerns with illness and disease; compulsions regarding excessive cleaning and washing, checking compulsions, and miscellaneous compulsions such as excoriation and trichotillomania  Mental Status Exam:  Appearance:   Neat     Behavior:  Appropriate, Sharing, and Motivated  Motor:  Normal  Speech/Language:   Clear and Coherent and Normal Rate  Affect:  Appropriate and Congruent  Mood:  normal  Thought process:  normal  Thought content:    WNL  Sensory/Perceptual disturbances:    WNL  Orientation:  oriented to person, place, time/date, and situation  Attention:  Good  Concentration:  Good  Memory:  WNL  Fund of knowledge:   Good  Insight:    Good  Judgment:   Good  Impulse Control:  Fair   Risk Assessment: Danger to Self:  No Self-injurious Behavior: No Danger to Others: No Duty to Warn:no Physical Aggression / Violence:No  Access to Firearms a concern: No  Gang Involvement:No   Subjective: Patient presented to session to address concerns of anxiety, depression, and obsessive-compulsive tendencies.  Patient voiced minimal progress at this time.  Counselor facilitated YBOCS symptom checklist and patient identified primary obsessions as aggressive, specifically fear of not being careful enough or being responsible for something terrible happening;  somatic obsession of concern with illness or disease; miscellaneous compulsions of trichotillomania and excoriation, which she identifies as leading to episodes of MRSA infection.  Patient scored a 20 indicating moderate OCD.  Patient described having intrusive random thoughts and compulsions since childhood.  She reported her fears to be exacerbated in regards to her children and fear of illness, especially as relates experience of immune compromise with one of her children.  She identified being high functioning around what she considers to be significantly preoccupying obsessions and compulsions, and to have learned to mask.  Patient processed experience of grief and loss by history, and ongoing sadness around painful circumstances (particularly recent loss of friend).  She reflected on her anger and how it is difficult for her to see other losses as relatable, and counselor actively listened, normalized and affirmed patient feelings and experience, and helped facilitate insight into the alienating nature of tragic losses, and a sense of impossibility and filling the void.  Patient also processed interpersonal concerns as relates her husband and family of origin.  Counselor and patient began to discuss patient treatment plan, and counselor provided referral for OCD specialist Eugene Naughton.  Interventions: Solution-Oriented/Positive Psychology, Humanistic/Existential, and Insight-Oriented, Referral  Diagnosis:   ICD-10-CM   1. Generalized anxiety disorder  F41.1     2. Major depressive disorder, recurrent episode, moderate (HCC)  F33.1     3. Mixed obsessional thoughts and acts  F42.2       Plan: Patient is scheduled for follow-up; continue to discuss treatment plan and obtain consent.  STG between sessions for patient to consult with OCD specialist referral provided by counselor, should she wish to enhance treatment for these specific concerns.   Progress note was  dictated with Dragon and  reviewed for accuracy.  Anthon Kins, [facility]

## 2023-05-20 DIAGNOSIS — Z01419 Encounter for gynecological examination (general) (routine) without abnormal findings: Secondary | ICD-10-CM | POA: Diagnosis not present

## 2023-05-20 DIAGNOSIS — Z1331 Encounter for screening for depression: Secondary | ICD-10-CM | POA: Diagnosis not present

## 2023-05-20 DIAGNOSIS — Z124 Encounter for screening for malignant neoplasm of cervix: Secondary | ICD-10-CM | POA: Diagnosis not present

## 2023-05-28 DIAGNOSIS — F424 Excoriation (skin-picking) disorder: Secondary | ICD-10-CM | POA: Diagnosis not present

## 2023-06-07 DIAGNOSIS — R92342 Mammographic extreme density, left breast: Secondary | ICD-10-CM | POA: Diagnosis not present

## 2023-06-07 DIAGNOSIS — N6342 Unspecified lump in left breast, subareolar: Secondary | ICD-10-CM | POA: Diagnosis not present

## 2023-06-07 DIAGNOSIS — R92343 Mammographic extreme density, bilateral breasts: Secondary | ICD-10-CM | POA: Diagnosis not present

## 2023-06-11 DIAGNOSIS — F424 Excoriation (skin-picking) disorder: Secondary | ICD-10-CM | POA: Diagnosis not present

## 2023-06-13 ENCOUNTER — Ambulatory Visit: Admitting: Professional Counselor

## 2023-06-13 ENCOUNTER — Encounter: Payer: Self-pay | Admitting: Professional Counselor

## 2023-06-13 DIAGNOSIS — F331 Major depressive disorder, recurrent, moderate: Secondary | ICD-10-CM

## 2023-06-13 DIAGNOSIS — F411 Generalized anxiety disorder: Secondary | ICD-10-CM | POA: Diagnosis not present

## 2023-06-13 NOTE — Progress Notes (Signed)
      [facility] Counselor/Therapist Progress Note  Patient ID: Elizabeth Garrison, MRN: [id number],    Date: 06/13/2023  Time Spent: 9:12 AM - 10:11 AM   Treatment Type: Individual Therapy  Reported Symptoms: tearfulness, worries, stress, interpersonal concerns, restlessness, nervousness, trouble relaxing, frustration, irritability, catastrophic thinking, low mood, low self esteem  Mental Status Exam:  Appearance:   Neat     Behavior:  Appropriate, Sharing, and Motivated  Motor:  Normal  Speech/Language:   Clear and Coherent and Normal Rate  Affect:  Tearful  Mood:  sad, frustrated  Thought process:  normal  Thought content:    WNL  Sensory/Perceptual disturbances:    WNL  Orientation:  oriented to person, place, time/date, and situation  Attention:  Good  Concentration:  Good  Memory:  WNL  Fund of knowledge:   Good  Insight:    Good  Judgment:   Good  Impulse Control:  Good   Risk Assessment: Danger to Self:  No Self-injurious Behavior: No Danger to Others: No Duty to Warn:no Physical Aggression / Violence:No  Access to Firearms a concern: No  Gang Involvement:No   Subjective: Patient presented to session to address concerns of anxiety and depression.  She reported mixed progress at this time.  She reported having consulted with specialist per counselor referral for OCD/body focused repetitive behaviors.  She processed the experience of interpersonal dynamics and high expressed emotions within her family, and her tendency to over function to meet the needs of others.  Counselor and patient discussed patient status as oldest of siblings, and female and implication to her role, and others expectations, and consequences of her not meeting them.  Counselor helped facilitate patient insight into empowering others to manage themselves as is reasonable, and encouraged mindfulness around patient prioritizing of self and immediate family.  Counselor and patient discussed patient  treatment plan, and patient gave her consent.  Patient voiced intention to see counselor for modalities/interventions below, and see referred specialist for OCD/body focused repetitive behaviors.  Interventions: Assertiveness/Communication, Solution-Oriented/Positive Psychology, Humanistic/Existential, and Insight-Oriented  Diagnosis:   ICD-10-CM   1. Generalized anxiety disorder  F41.1     2. Major depressive disorder, recurrent episode, moderate (HCC)  F33.1       Plan: Pt os scheduled for a follow-up. Continue process work and Conservation officer, historic buildings, including around self esteem and assertiveness skill building. Pt STG between sessions to practice disengaging from interpersonal situations and people of concern, be mindful of opportunities and practice of where she is able to allow others to be responsible for themselves, including emotionally.   [name], [facility]

## 2023-07-02 DIAGNOSIS — F424 Excoriation (skin-picking) disorder: Secondary | ICD-10-CM | POA: Diagnosis not present

## 2023-07-15 ENCOUNTER — Encounter: Payer: Self-pay | Admitting: Professional Counselor

## 2023-07-15 ENCOUNTER — Ambulatory Visit (INDEPENDENT_AMBULATORY_CARE_PROVIDER_SITE_OTHER): Admitting: Professional Counselor

## 2023-07-15 DIAGNOSIS — F411 Generalized anxiety disorder: Secondary | ICD-10-CM

## 2023-07-15 DIAGNOSIS — F330 Major depressive disorder, recurrent, mild: Secondary | ICD-10-CM

## 2023-07-15 NOTE — Progress Notes (Signed)
      [facility] Counselor/Therapist Progress Note  Patient ID: ADYSON VANBUREN, MRN: [id number],    Date: 07/15/2023  Time Spent: 9:12 AM - 10:10 AM   Treatment Type: Individual Therapy  Reported Symptoms: restlessness, worries, fears, preoccupying thoughts, anxiousness, grief/loss, intermittent low mood, hypervigilance, interpersonal concerns  Mental Status Exam:  Appearance:   Neat     Behavior:  Appropriate, Sharing, and Motivated  Motor:  Normal  Speech/Language:   Clear and Coherent and Normal Rate  Affect:  Appropriate and Congruent  Mood:  normal  Thought process:  normal  Thought content:    WNL  Sensory/Perceptual disturbances:    WNL  Orientation:  oriented to person, place, time/date, and situation  Attention:  Good  Concentration:  Good  Memory:  WNL  Fund of knowledge:   Good  Insight:    Good  Judgment:   Good  Impulse Control:  Good   Risk Assessment: Danger to Self:  No Self-injurious Behavior: No Danger to Others: No Duty to Warn:no Physical Aggression / Violence:No  Access to Firearms a concern: No  Gang Involvement:No   Subjective: Patient presented to session to address concerns of anxiety and depression.  She reported mixed progress at this time.  She reported trying to create buffers in interpersonal relationships of concern, and counselor helped to reinforce strategies around these boundaries.  Patient voiced wishing that she could close tabs full of responsibility in her mind that lead her to persist and over functioning for others.  Counselor recommended Al-Anon as relates some of patient's extended family concerns.  Patient voiced having been on a vacation and having noticed that she felt better.  Counselor reinforced patient acknowledgment of space from overbearing others, positive activities and opposing actions as helping to mitigate symptomology, and help facilitate insight around patient self worth, needs and strengths.  Interventions:  Solution-Oriented/Positive Psychology, Humanistic/Existential, and Insight-Oriented  Diagnosis:   ICD-10-CM   1. Generalized anxiety disorder  F41.1     2. Major depressive disorder, recurrent episode, mild (HCC)  F33.0       Plan: Pt is scheduled for a follow-up; continue process work and developing coping skills. STG between sessions to continue to create boundaries with family members, practice visualizations to reduce response to exorbitant needs of others, practice thought stopping technique, practice here and now versus what if cognitive restructuring, consider attending Al-Anon meeting.  [name], [facility]

## 2023-08-05 ENCOUNTER — Ambulatory Visit: Admitting: Professional Counselor

## 2023-08-05 ENCOUNTER — Encounter: Payer: Self-pay | Admitting: Professional Counselor

## 2023-08-05 DIAGNOSIS — F331 Major depressive disorder, recurrent, moderate: Secondary | ICD-10-CM

## 2023-08-05 DIAGNOSIS — F422 Mixed obsessional thoughts and acts: Secondary | ICD-10-CM | POA: Diagnosis not present

## 2023-08-05 DIAGNOSIS — F411 Generalized anxiety disorder: Secondary | ICD-10-CM

## 2023-08-05 NOTE — Progress Notes (Signed)
      [facility] Counselor/Therapist Progress Note  Patient ID: Elizabeth Garrison, MRN: [id number],    Date: 08/05/2023  Time Spent: 9:15 AM - 10:14 AM   Treatment Type: Individual Therapy  Reported Symptoms: tearfulness, worries, preoccupying thoughts, intrusive thoughts, restlessness, nervousness, anxiousness, trouble relaxing, frustration, catastrophic thinking, interpersonal concerns, body-focused repetitive behavior (trichotillomania)  Mental Status Exam:  Appearance:   Casual     Behavior:  Appropriate, Sharing, and Motivated  Motor:  Normal  Speech/Language:   Clear and Coherent and Normal Rate  Affect:  Tearful  Mood:  sad  Thought process:  normal  Thought content:    WNL  Sensory/Perceptual disturbances:    WNL  Orientation:  oriented to person, place, time/date, and situation  Attention:  Good  Concentration:  Good  Memory:  WNL  Fund of knowledge:   Good  Insight:    Good  Judgment:   Good  Impulse Control:  Good   Risk Assessment: Danger to Self:  No Self-injurious Behavior: No Danger to Others: No Duty to Warn:no Physical Aggression / Violence:No  Access to Firearms a concern: No  Gang Involvement:No   Subjective: Patient presented to session to address concerns of anxiety, depression and OCD.  She reported mixed progress.  Patient processed experience of her mother having a challenging episode and counselor and patient discussed strategies to disengage and not reinforce negative attention seeking, and discussed implications of parentification to her wellbeing over time.  Patient processed experience of her OCD symptoms and counselor assisted patient with DBT strategies of opposing actions, resourcing self compassion, CBT cognitive restructuring, and suggested additional outlets such as restorative yoga practice.  Interventions: Cognitive Behavioral Therapy, Assertiveness/Communication, Solution-Oriented/Positive Psychology, Humanistic/Existential, and  Insight-Oriented, DBT  Diagnosis:   ICD-10-CM   1. Generalized anxiety disorder  F41.1     2. Major depressive disorder, recurrent episode, moderate (HCC)  F33.1     3. Mixed obsessional thoughts and acts  F42.2       Plan: Pt is scheduled for a follow-up; continue process work and developing coping skills. STG between sessions to continue to practice cognitive restructuring, resource self compassion, practice opposing actions per DBT skillset, consider restorative yoga practice or other ways to prioritize practice of relaxation.  [name], [facility]

## 2023-08-20 IMAGING — CT CT ORBITS W/ CM
3 series · 15 of 39 positions shown, 18 images · IV contrast (APPLIED)
Comparison: No pertinent prior exam.

CLINICAL DATA: Ocular pain with periorbital cellulitis. Infection
of the right eyebrow.

EXAM:
CT ORBITS WITH CONTRAST
TECHNIQUE: Multidetector CT images was performed according to the standard
protocol following intravenous contrast administration.
CONTRAST:  75mL OMNIPAQUE IOHEXOL 350 MG/ML SOLN

[Series 4: coronal soft · coronal · 0.23mm/px · 3 of 101 slices shown]
[im 41/101  bone]
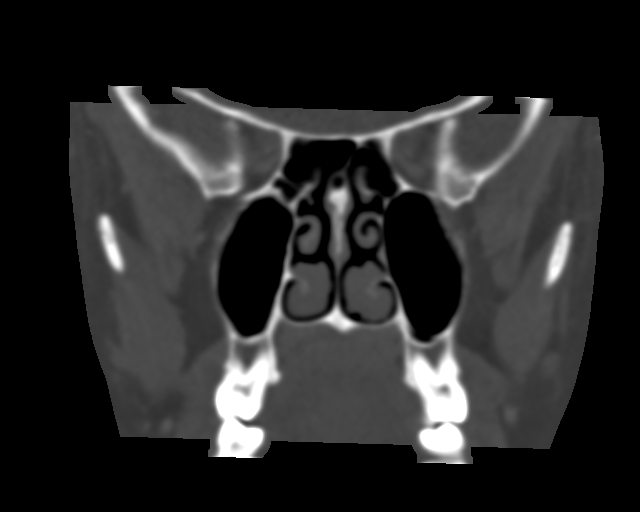
[im 51/101  bone]
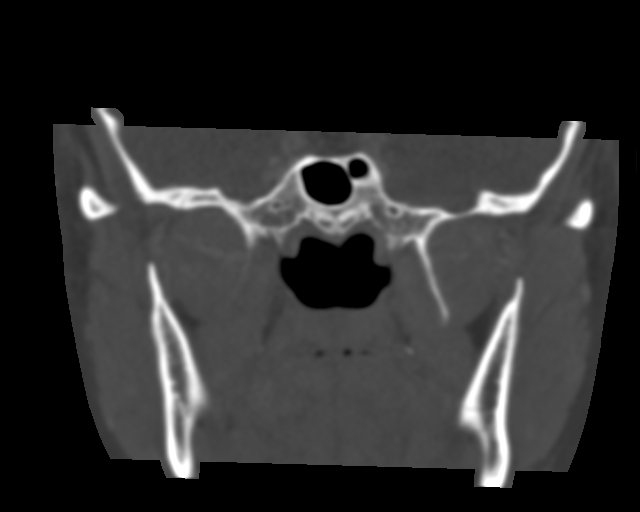
[im 61/101  bone]
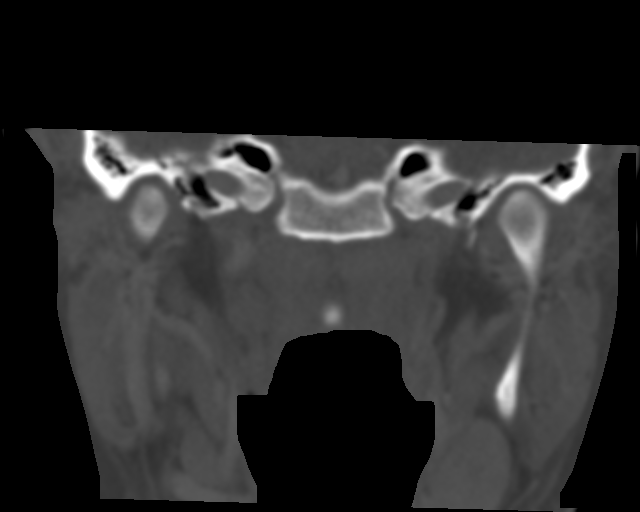

[Series 5: sagittal soft · sagittal · 0.29mm/px · 3 of 76 slices shown]
[im 33/76  bone]
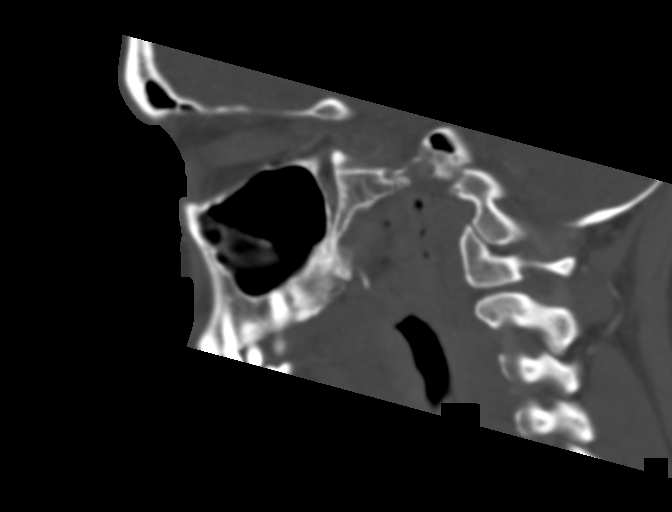
[im 38/76  bone]
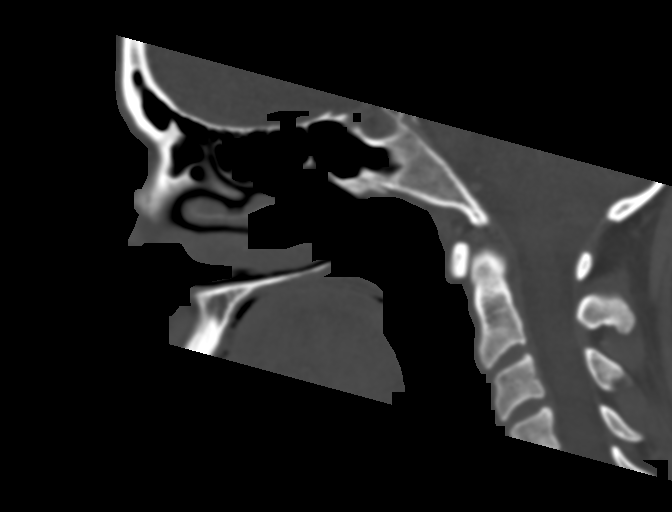
[im 43/76  bone]
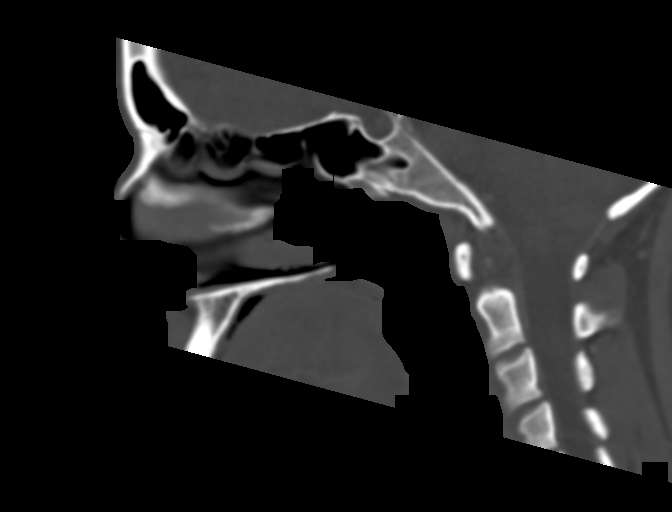

[Series 6: coronal bone · coronal · 0.29mm/px · 9 of 106 slices shown, 12 images]
[im 11/106  brain]
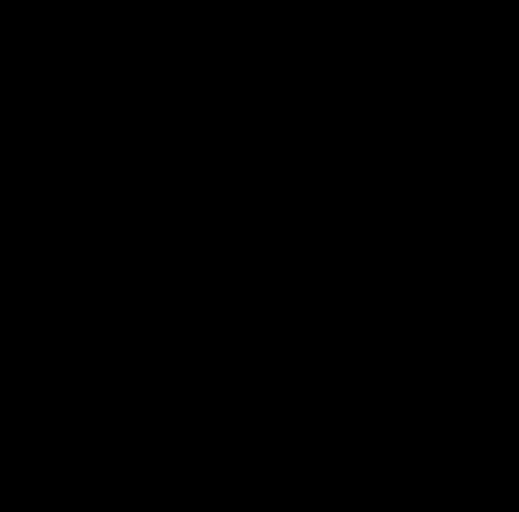
[im 11/106  bone]
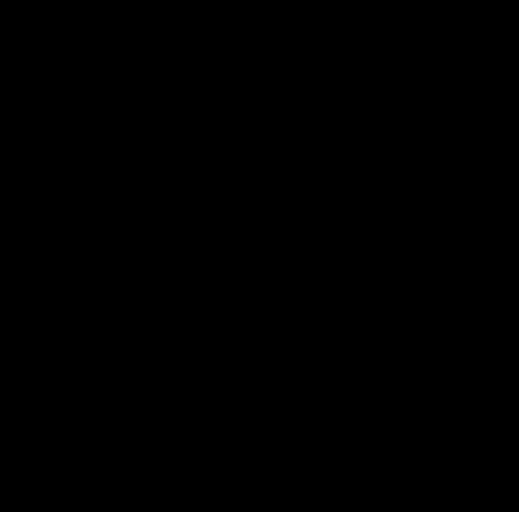
[im 22/106  bone]
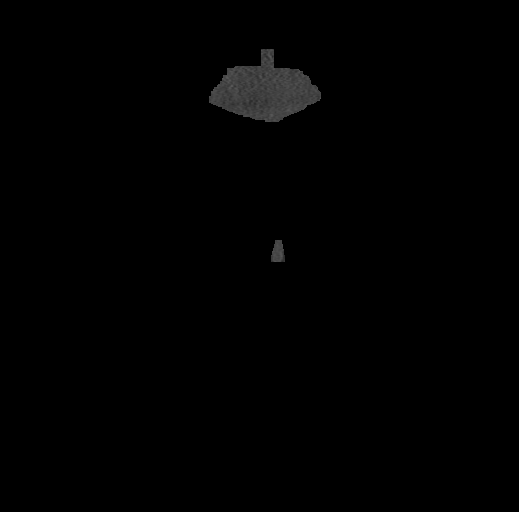
[im 32/106  bone]
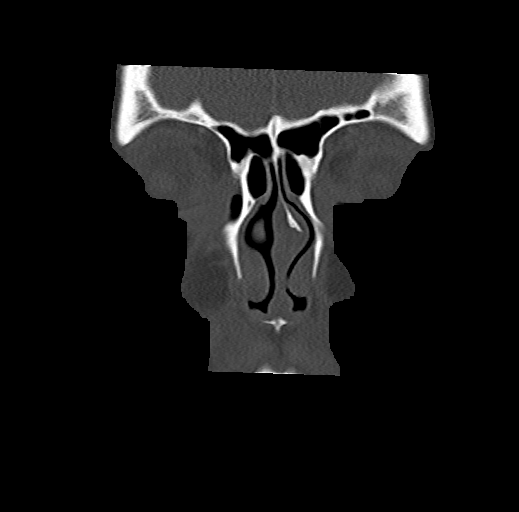
[im 43/106  bone]
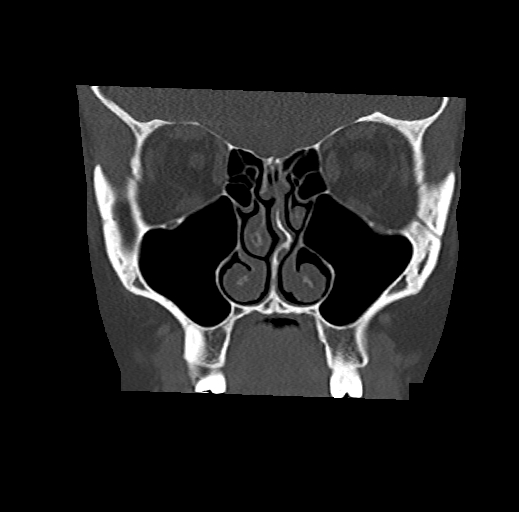
[im 53/106  brain]
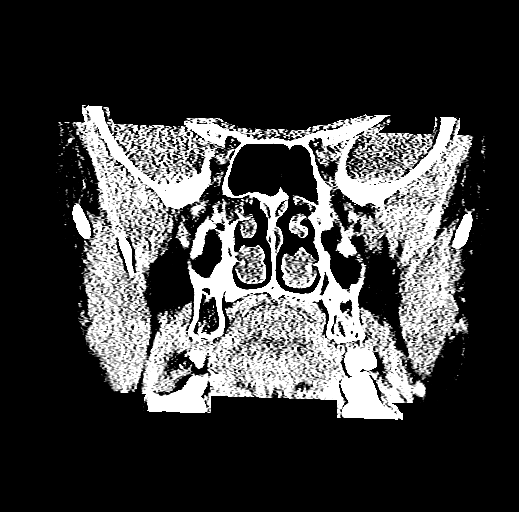
[im 53/106  bone]
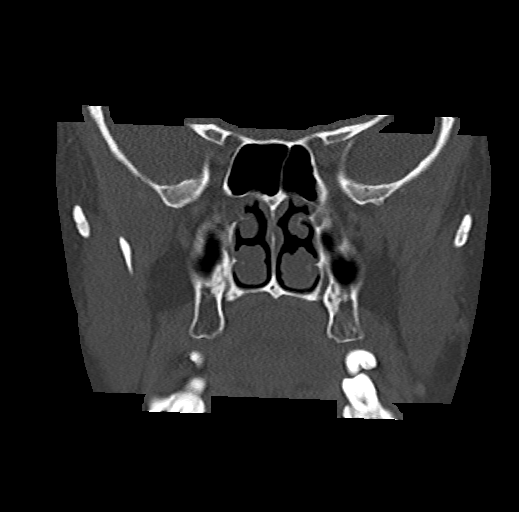
[im 64/106  bone]
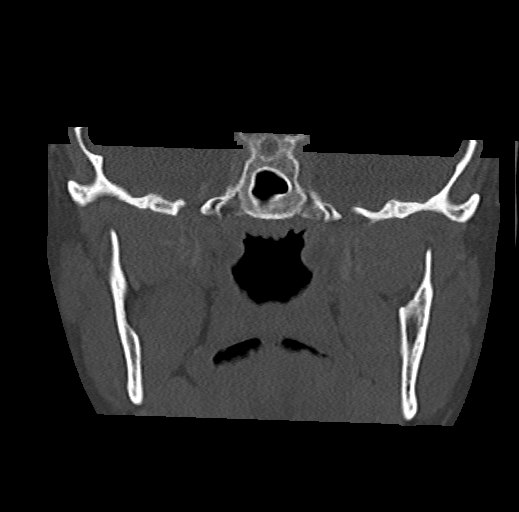
[im 74/106  bone]
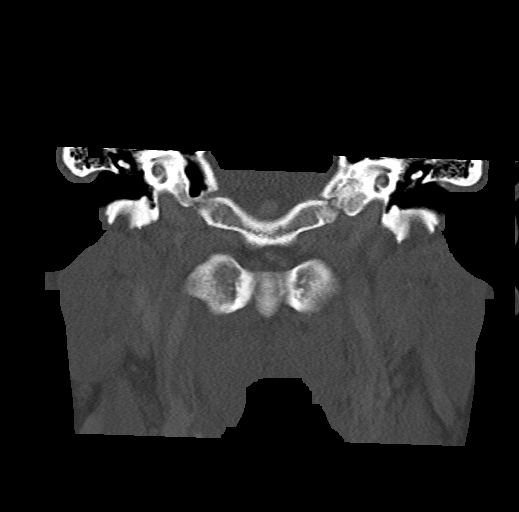
[im 85/106  bone]
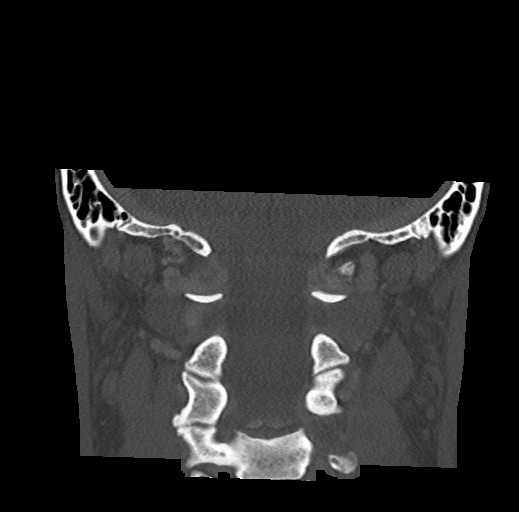
[im 95/106  brain]
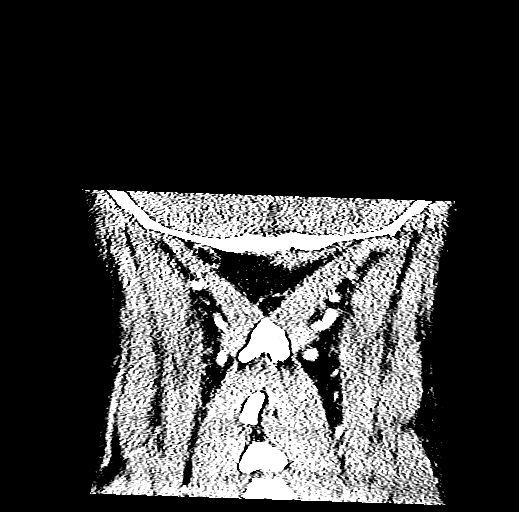
[im 95/106  bone]
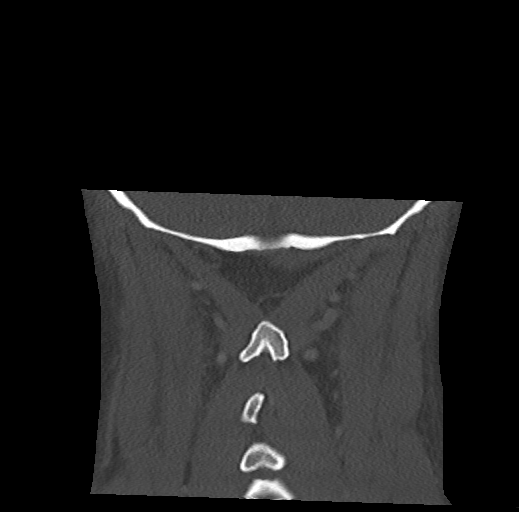

[15 of 39 positions shown; findings below may reference images not displayed]

FINDINGS: Orbits: The left is normal. On the right, there is soft tissue
swelling in the region of the eyebrow and superior periorbital soft
tissues consistent with infectious inflammation as described
clinically. There is no evidence of a low-density drainable
collection by CT. No postseptal orbital inflammation.

Visible paranasal sinuses: Clear

Soft tissues: Otherwise negative

Osseous: Normal

Limited intracranial: Normal
IMPRESSION: Soft tissue swelling in the region of the right eyebrow and superior
right superficial periorbital soft tissues consistent with regional
cellulitis. I do not see a low-density collection to indicate a
definitely drainable component. No evidence of postseptal orbital
inflammation.

## 2023-08-26 ENCOUNTER — Ambulatory Visit: Admitting: Professional Counselor

## 2023-08-26 ENCOUNTER — Encounter: Payer: Self-pay | Admitting: Professional Counselor

## 2023-08-26 DIAGNOSIS — F330 Major depressive disorder, recurrent, mild: Secondary | ICD-10-CM | POA: Diagnosis not present

## 2023-08-26 DIAGNOSIS — F411 Generalized anxiety disorder: Secondary | ICD-10-CM | POA: Diagnosis not present

## 2023-08-26 DIAGNOSIS — F422 Mixed obsessional thoughts and acts: Secondary | ICD-10-CM | POA: Diagnosis not present

## 2023-08-26 NOTE — Progress Notes (Unsigned)
      [facility] Counselor/Therapist Progress Note  Patient ID: Elizabeth Garrison, MRN: [id number],    Date: 08/26/2023  Time Spent: 9:12 AM to 10:11 AM  Treatment Type: Individual Therapy  Reported Symptoms: Worries, stress, anxiousness, trichotillomania, interpersonal concerns, trouble relaxing, restlessness, irritability, frustration, catastrophic thinking, low mood  Mental Status Exam:  Appearance:   Neat     Behavior:  Appropriate, Sharing, and Motivated  Motor:  Normal  Speech/Language:   Clear and Coherent and Normal Rate  Affect:  Tearfulness  Mood:  Sad, worried  Thought process:  normal  Thought content:    WNL  Sensory/Perceptual disturbances:    WNL  Orientation:  oriented to person, place, time/date, and situation  Attention:  Good  Concentration:  Good  Memory:  WNL  Fund of knowledge:   Good  Insight:    Good  Judgment:   Good  Impulse Control:  Good   Risk Assessment: Danger to Self:  No Self-injurious Behavior: No Danger to Others: No Duty to Warn:no Physical Aggression / Violence:No  Access to Firearms a concern: No  Gang Involvement:No   Subjective: Patient presented to session to address concerns of anxiety, depression and OCD.  Patient reported minimal progress at this time.  She reported upcoming surgery of her son, work, extended family, approach to school year for her children and other stressors as challenging at this time.  She reported obsessive experience of nesting and cleaning, being unable to relax, and over functioning across spheres of life.  Counselor helped to facilitate insight around patient emphasis on control of things she is able to exert control around when externals feel uncontrollable, and helped patient to resource parts of herself that are able to feel free and let go of perfectionistic expectations.  Counselor and patient discussed how patient can resource cognitive restructuring to limit anxiety in upcoming stressful event of  son's surgery, including as relates reminding herself that this event is not a prior resonating event, and to reassure herself that she has created the best possible variables and as emergency plans and therefore can limit obsessing over them.  Counselor shared breath work exercises of elevator breathing, and suggested patient resource attunement to be present with son during his recovery.  Patient identified upcoming anniversary of grief/loss of family friend, and counselor patient discussed emotional protection around circumstance.  Interventions: Cognitive Behavioral Therapy, Solution-Oriented/Positive Psychology, Humanistic/Existential, and Insight-Oriented  Diagnosis:   ICD-10-CM   1. Generalized anxiety disorder  F41.1     2. Mixed obsessional thoughts and acts  F42.2     3. Major depressive disorder, recurrent episode, mild (HCC)  F33.0       Plan: Patient is scheduled for follow-up; continue process work and developing coping skills.  STG between sessions to resource CBT skill set as needed, resource relaxation techniques, breath work, mindfulness, and prioritize presence versus overdoing; limit overwhelming self expectations around upcoming birthday party and adjust plan as discussed.  Elizabeth Garrison, [facility]

## 2023-09-11 ENCOUNTER — Ambulatory Visit: Admitting: Professional Counselor

## 2023-10-02 ENCOUNTER — Ambulatory Visit: Admitting: Professional Counselor

## 2023-10-02 ENCOUNTER — Encounter: Payer: Self-pay | Admitting: Professional Counselor

## 2023-10-02 DIAGNOSIS — F411 Generalized anxiety disorder: Secondary | ICD-10-CM

## 2023-10-02 DIAGNOSIS — F422 Mixed obsessional thoughts and acts: Secondary | ICD-10-CM

## 2023-10-02 NOTE — Progress Notes (Signed)
      [facility] Counselor/Therapist Progress Note  Patient ID: Elizabeth Garrison, MRN: [id number],    Date: 10/02/2023  Time Spent: 9:14 AM to 10:12 AM  Treatment Type: Individual Therapy  Reported Symptoms: Stress, anxiousness, worries, restlessness, catastrophic thinking, irritability, trouble relaxing, intermittent low mood, self-esteem concerns, restlessness, fears, interpersonal concerns  Mental Status Exam:  Appearance:   Neat     Behavior:  Appropriate, Sharing, and Motivated  Motor:  Normal  Speech/Language:   Clear and Coherent and Normal Rate  Affect:  Appropriate and Congruent  Mood:  normal  Thought process:  normal  Thought content:    WNL  Sensory/Perceptual disturbances:    WNL  Orientation:  oriented to person, place, time/date, and situation  Attention:  Good  Concentration:  Good  Memory:  WNL  Fund of knowledge:   Good  Insight:    Good  Judgment:   Good  Impulse Control:  Good   Risk Assessment: Danger to Self:  No Self-injurious Behavior: No Danger to Others: No Duty to Warn:no Physical Aggression / Violence:No  Access to Firearms a concern: No  Gang Involvement:No   Subjective: Patient presented to session to address concerns of anxiety and OCD.  She reported makes progress at this time.  She processed experience of having had a successful vacation with a friend abroad, and to have experienced limited fears and anxiety as anticipated, and reflected on positivity of being able to let go.  Counselor celebrated progress with patient and reinforced patient capacity to be free of anxious thoughts and experience.  Patient processed experience of irrational sense of scarcity financially, and hypervigilance and health worries of self and loved ones, including as relates memory of saving for a breathing device at 38 years old after her grandmother had 1, and exacerbated stress around birthdays.  Counselor help facilitate insight into patient continuing to  practice skills of letting go of over functioning, and leaning into opportunities for presence.  Counselor and patient discussed the daily moment to moment practice of letting go and its benefit to approach to feelings, life, experiences and relationships over time.  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  Counselor actively listened, affirmed patient feelings and experience, and held space for patient grief/loss.  Interventions: Solution-Oriented/Positive Psychology, Humanistic/Existential, and Insight-Oriented, CBT  Diagnosis:   ICD-10-CM   1. Generalized anxiety disorder  F41.1     2. Mixed obsessional thoughts and acts  F42.2       Plan: Patient is scheduled for follow-up; continue process work and developing coping skills.  STG between sessions for patient to continue practice of letting go in small ways to increase capacity for experience of happiness and presence in life and relationships.  Elizabeth Garrison, [facility]

## 2023-10-03 DIAGNOSIS — D2272 Melanocytic nevi of left lower limb, including hip: Secondary | ICD-10-CM | POA: Diagnosis not present

## 2023-10-03 DIAGNOSIS — L814 Other melanin hyperpigmentation: Secondary | ICD-10-CM | POA: Diagnosis not present

## 2023-10-03 DIAGNOSIS — D225 Melanocytic nevi of trunk: Secondary | ICD-10-CM | POA: Diagnosis not present

## 2023-10-22 ENCOUNTER — Telehealth: Payer: Self-pay | Admitting: Adult Health

## 2023-10-22 ENCOUNTER — Encounter: Payer: Self-pay | Admitting: Professional Counselor

## 2023-10-22 ENCOUNTER — Other Ambulatory Visit: Payer: Self-pay

## 2023-10-22 ENCOUNTER — Telehealth: Payer: Self-pay

## 2023-10-22 ENCOUNTER — Ambulatory Visit: Admitting: Professional Counselor

## 2023-10-22 DIAGNOSIS — F422 Mixed obsessional thoughts and acts: Secondary | ICD-10-CM

## 2023-10-22 DIAGNOSIS — F331 Major depressive disorder, recurrent, moderate: Secondary | ICD-10-CM | POA: Diagnosis not present

## 2023-10-22 DIAGNOSIS — F411 Generalized anxiety disorder: Secondary | ICD-10-CM

## 2023-10-22 MED ORDER — ALPRAZOLAM 0.5 MG PO TABS: 0.5000 mg | ORAL_TABLET | Freq: Every day | ORAL | 0 refills | Status: DC | PRN

## 2023-10-22 NOTE — Telephone Encounter (Signed)
 Appt 01/14/24    Nees rf of Xanax  and Zoloft     [facility] [address]  [place]

## 2023-10-22 NOTE — Telephone Encounter (Signed)
 Pended Xanax . Pt has RF of Zoloft .

## 2023-10-22 NOTE — Progress Notes (Signed)
      [facility] Counselor/Therapist Progress Note  Patient ID: Elizabeth Garrison, MRN: [id number],    Date: 10/22/2023  Time Spent: 9:16 AM to 10:13 AM  Treatment Type: Individual Therapy  Reported Symptoms: Worries, preoccupying thoughts, sadness, tearfulness, low mood, anxiousness, restlessness, trouble relaxing, frustration, catastrophic thinking, stress, phase of life concerns, interpersonal concerns, career concerns, transitional concerns   Mental Status Exam:  Appearance:   Neat     Behavior:  Appropriate, Sharing, and Motivated  Motor:  Normal  Speech/Language:   Clear and Coherent and Normal Rate  Affect:  Tearful  Mood:  anxious  Thought process:  normal  Thought content:    WNL  Sensory/Perceptual disturbances:    WNL  Orientation:  oriented to person, place, time/date, and situation  Attention:  Good  Concentration:  Good  Memory:  WNL  Fund of knowledge:   Good  Insight:    Good  Judgment:   Good  Impulse Control:  Good   Risk Assessment: Danger to Self:  No Self-injurious Behavior: No Danger to Others: No Duty to Warn:no Physical Aggression / Violence:No  Access to Firearms a concern: No  Gang Involvement:No   Subjective: Patient presented to session to address concerns of anxiety and depression.  She reported minimal progress at this time.  She reported feeling to be holding her breath on a continuum recently.  She processed experience of loss of inhibition and resulting sense of loss of control, and at the same time ways in which loss of control illuminated considerations of her innate needs.  Patient also processed discernment around related factors including as relates family relationship of significance, and pending new business role, and how these areas of her life are challenging her sense of self worth.  Patient explored perfectionistic tendencies, over functioning for others, parentification experience.  She reflected on her visceral response to needs  of others after return from restorative trip.  Counselor actively listened, affirmed patient feelings and experience, and helped to provide psychoeducation regarding hypervigilance, toxic stress, and to facilitate insight into patient relational experience and patterns of behavior.  Counselor encouraged patient to teacher, music of high stress elements to life with regular restorative practices.  Counselor and patient discussed patient resourcing visualizations and cognitive restructuring skill set.  Interventions: Cognitive Behavioral Therapy, Solution-Oriented/Positive Psychology, Humanistic/Existential, Insight-Oriented, and Resourcing  Diagnosis:   ICD-10-CM   1. Generalized anxiety disorder  F41.1     2. Major depressive disorder, recurrent episode, moderate (HCC)  F33.1     3. Mixed obsessional thoughts and acts  F42.2       Plan: Patient is scheduled for follow-up; continue process work and developing coping skills.  Patient short-term goal between sessions to prioritize resourcing counterbalance activities including restorative yoga, acupuncture, positive visualizations, cognitive restructuring, limiting over functioning/apologizing tendencies and increase positive self affirmations.  Elizabeth Garrison, [facility]

## 2023-10-24 NOTE — Telephone Encounter (Signed)
 SABRA

## 2023-11-05 ENCOUNTER — Ambulatory Visit: Admitting: Professional Counselor

## 2023-11-05 ENCOUNTER — Encounter: Payer: Self-pay | Admitting: Professional Counselor

## 2023-11-05 DIAGNOSIS — F411 Generalized anxiety disorder: Secondary | ICD-10-CM

## 2023-11-05 DIAGNOSIS — F331 Major depressive disorder, recurrent, moderate: Secondary | ICD-10-CM | POA: Diagnosis not present

## 2023-11-05 NOTE — Progress Notes (Signed)
      [facility] Counselor/Therapist Progress Note  Patient ID: Elizabeth Garrison, MRN: [id number],    Date: 11/05/2023  Time Spent: 9:15 AM to 10:15 AM  Treatment Type: Individual Therapy  Reported Symptoms: Tearfulness, sadness, grief/loss, low mood, anhedonia, worries, sense of overwhelm, interpersonal concerns, preoccupying thoughts, perfectionistic tendencies, stress  Mental Status Exam:  Appearance:   Neat     Behavior:  Appropriate and Sharing  Motor:  Normal  Speech/Language:   Clear and Coherent and Normal Rate  Affect:  Tearful  Mood:  sad  Thought process:  normal  Thought content:    WNL  Sensory/Perceptual disturbances:    WNL  Orientation:  oriented to person, place, time/date, and situation  Attention:  Good  Concentration:  Good  Memory:  WNL  Fund of knowledge:   Good  Insight:    Good  Judgment:   Good  Impulse Control:  Good   Risk Assessment: Danger to Self:  No Self-injurious Behavior: No Danger to Others: No Duty to Warn:no Physical Aggression / Violence:No  Access to Firearms a concern: No  Gang Involvement:No   Subjective: Patient presented to session to address concerns of depression and anxiety.  She reported minimal progress at this time.  Patient identified continued challenges regarding preoccupied thoughts including as relates to upcoming holidays and heightened expectations for family happiness.  She identified worries that her anxiety will spoil festivities even though intellectually she knows disappointment is unlikely.  Patient also processed experience of anniversary of grief and loss for both herself and her husband at this time of year, and complicated feelings around the circumstances.  Counselor actively listened, affirmed patient feelings and experience, held space for patient grief, and utilized CBT/DBT to help patient resource coping skills such as cognitive restructuring, choosing opposing actions, mindfulness and turning of mind, and  counselor encouraged patient to resource coping outlets such as yoga to prioritize restorative self-care time and efforts.  Interventions: Solution-Oriented/Positive Psychology, Humanistic/Existential, Grief Therapy, and Insight-Oriented, CBT, DBT  Diagnosis:   ICD-10-CM   1. Generalized anxiety disorder  F41.1     2. Major depressive disorder, recurrent episode, moderate (HCC)  F33.1       Plan: Patient is scheduled for follow-up; continue process work and developing coping skills.  Patient short-term goal between sessions to practice cognitive restructuring, mindfulness, prioritize self-care including listening to her body's needs, doing less and being more, and consider restorative yoga practice.  [name], [facility]

## 2023-11-19 ENCOUNTER — Ambulatory Visit: Admitting: Professional Counselor

## 2023-12-16 ENCOUNTER — Ambulatory Visit: Admitting: Professional Counselor

## 2024-01-13 ENCOUNTER — Encounter: Payer: Self-pay | Admitting: Professional Counselor

## 2024-01-13 ENCOUNTER — Ambulatory Visit: Admitting: Professional Counselor

## 2024-01-13 DIAGNOSIS — F411 Generalized anxiety disorder: Secondary | ICD-10-CM | POA: Diagnosis not present

## 2024-01-13 DIAGNOSIS — F330 Major depressive disorder, recurrent, mild: Secondary | ICD-10-CM

## 2024-01-13 NOTE — Progress Notes (Signed)
"   °      [facility] Counselor/Therapist Progress Note  Patient ID: Elizabeth Garrison, MRN: [id number],    Date: 01/13/2024  Time Spent: 9:13 AM - 10:13 AM   Treatment Type: Individual Therapy  Reported Symptoms: anxiousness, worries, trouble relaxing, irritability, restlessness, stress, low mood, self esteem concerns, interpersonal concerns, career concerns  Mental Status Exam:  Appearance:   Neat     Behavior:  Appropriate and Sharing  Motor:  Normal  Speech/Language:   Clear and Coherent and Normal Rate  Affect:  Appropriate and Congruent  Mood:  normal  Thought process:  normal  Thought content:    WNL  Sensory/Perceptual disturbances:    WNL  Orientation:  oriented to person, place, time/date, and situation  Attention:  Good  Concentration:  Good  Memory:  WNL  Fund of knowledge:   Good  Insight:    Good  Judgment:   Good  Impulse Control:  Good   Risk Assessment: Danger to Self:  No Self-injurious Behavior: No Danger to Others: No Duty to Warn:no Physical Aggression / Violence:No  Access to Firearms a concern: No  Gang Involvement:No   Subjective: Patient presented to session to address concerns of anxiety and depression.  She reported progress at this time.  Patient reported having been mindful of practicing saying no to unreasonable request and to be limiting over functioning across spheres of life.  She identified being stressed about New Year's resolution, and giving herself grace to drop the resolution.  Counselor reinforced patient self-awareness, mindfulness and cognitive restructuring efforts.  Patient processed experience of upcoming work related stressors this week, and counselor assisted in developing strategies for self recovery.  Patient identified being unable to avoid exacerbated stress, and counselor help patient resource what is in her control and prioritization of approach to recovery from stress.  Counselor helped facilitate insight into patient exacerbated  stress around the holidays and pleasing others, and how the holidays had gone well, and were no longer a concern.  Counselor and patient discussed matters of acceptance and impermanence, and her resiliency factors.  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  Interventions: Cognitive Behavioral Therapy, Solution-Oriented/Positive Psychology, Humanistic/Existential, and Insight-Oriented  Diagnosis:   ICD-10-CM   1. Generalized anxiety disorder  F41.1     2. Major depressive disorder, recurrent episode, mild  F33.0       Plan: Patient is scheduled for follow-up; continue process work and developing coping skills.  Patient short-term goal between sessions to continue to implement boundaries were needed interpersonally, continue mindfulness around her needs and prioritizing of self-care, especially during times of stress.  Continue to reinforce approach to stressors as impermanent and to resource her resiliency and coping strategies such as yoga for tension release.  Elizabeth Garrison, [facility]                   "

## 2024-01-14 ENCOUNTER — Telehealth: Admitting: Adult Health

## 2024-01-27 ENCOUNTER — Telehealth: Admitting: Adult Health

## 2024-01-28 ENCOUNTER — Telehealth: Payer: Self-pay | Admitting: Adult Health

## 2024-01-28 ENCOUNTER — Encounter: Payer: Self-pay | Admitting: Professional Counselor

## 2024-01-28 ENCOUNTER — Ambulatory Visit: Admitting: Professional Counselor

## 2024-01-28 DIAGNOSIS — F331 Major depressive disorder, recurrent, moderate: Secondary | ICD-10-CM

## 2024-01-28 DIAGNOSIS — F422 Mixed obsessional thoughts and acts: Secondary | ICD-10-CM | POA: Diagnosis not present

## 2024-01-28 DIAGNOSIS — F411 Generalized anxiety disorder: Secondary | ICD-10-CM

## 2024-01-28 MED ORDER — SERTRALINE HCL 100 MG PO TABS: 150.0000 mg | ORAL_TABLET | Freq: Every day | ORAL | 0 refills | Status: DC

## 2024-01-28 NOTE — Telephone Encounter (Signed)
 Patient came into office for appt with EB and requested refill for Sertraline  100mg . States that pharmacy has no record of prescription being sent. PH: [phone] appt 1/28 Pharmacy [facility] [address] [place]

## 2024-01-28 NOTE — Progress Notes (Signed)
"   °      [facility] Counselor/Therapist Progress Note  Patient ID: Elizabeth Garrison, MRN: [id number],    Date: 01/28/2024  Time Spent: 9:10 AM to 10:12 AM  Treatment Type: Individual Therapy  Reported Symptoms: Tearfulness, grief/loss, sadness, intrusive thoughts, worries, anxiousness, obsessive/compulsive patterns of thinking and behavior, psychotropic medication concerns, low mood, restlessness, sense of overwhelm, trouble relaxing, irritability, catastrophic thinking, interpersonal concerns  Mental Status Exam:  Appearance:   Neat     Behavior:  Appropriate and Sharing  Motor:  Normal  Speech/Language:   Clear and Coherent and Normal Rate  Affect:  Appropriate, Congruent, and Tearful  Mood:  sad  Thought process:  normal  Thought content:    WNL  Sensory/Perceptual disturbances:    WNL  Orientation:  oriented to person, place, time/date, and situation  Attention:  Good  Concentration:  Good  Memory:  WNL  Fund of knowledge:   Good  Insight:    Good  Judgment:   Good  Impulse Control:  Good   Risk Assessment: Danger to Self:  No Self-injurious Behavior: No Danger to Others: No Duty to Warn:no Physical Aggression / Violence:No  Access to Firearms a concern: No  Gang Involvement:No   Subjective: Patient presented to session to address concerns of anxiety, depression and OCD.  Patient was concerned regarding meds and side effects from withdrawal not having renewed prescription; counselor assisted in facilitating assistance with psychiatrist at practice.  Patient identified exacerbated anxiety with difficulty coping with grief and loss, and sense of never being free of her preoccupying thoughts.  Counselor assisted with CBT strategies, including cognitive restructuring around limiting automatic negative thoughts and ruminations with counter thoughts.  Counselor and patient discussed related existential concerns at length.  Counselor also assisted patient in identifying strategies  for coping such as limiting social media exposure, protecting her personal time for emphasis on self-care, and strategies to distract from worries and reset mind racing.  Interventions: Cognitive Behavioral Therapy, Solution-Oriented/Positive Psychology, Humanistic/Existential, and Insight-Oriented  Diagnosis:   ICD-10-CM   1. Generalized anxiety disorder  F41.1     2. Major depressive disorder, recurrent episode, moderate (HCC)  F33.1     3. Mixed obsessional thoughts and acts  F42.2       Plan: Patient is scheduled for follow-up; continue process work and developing coping skills.  Patient short-term goal between sessions to resource pleasant activities such as crochet with family, protect her personal time for rest and relaxation, resource cognitive restructuring and thought stopping as needed, limit social media.  [name], [facility]                   "

## 2024-01-28 NOTE — Telephone Encounter (Signed)
 RF sent for 30-day supply. She is going to transition from [facility] to [place].

## 2024-02-04 ENCOUNTER — Ambulatory Visit: Admitting: Emergency Medicine

## 2024-02-05 ENCOUNTER — Telehealth: Admitting: Adult Health

## 2024-02-10 ENCOUNTER — Ambulatory Visit: Admitting: Professional Counselor

## 2024-02-12 ENCOUNTER — Encounter: Payer: Self-pay | Admitting: Emergency Medicine

## 2024-02-12 ENCOUNTER — Ambulatory Visit: Admitting: Emergency Medicine

## 2024-02-12 DIAGNOSIS — F422 Mixed obsessional thoughts and acts: Secondary | ICD-10-CM | POA: Diagnosis not present

## 2024-02-12 DIAGNOSIS — F331 Major depressive disorder, recurrent, moderate: Secondary | ICD-10-CM

## 2024-02-12 DIAGNOSIS — F411 Generalized anxiety disorder: Secondary | ICD-10-CM

## 2024-02-12 MED ORDER — SERTRALINE HCL 100 MG PO TABS: 150.0000 mg | ORAL_TABLET | Freq: Every day | ORAL | 0 refills | Status: AC

## 2024-02-12 MED ORDER — ALPRAZOLAM 0.5 MG PO TABS: 0.5000 mg | ORAL_TABLET | Freq: Every day | ORAL | 0 refills | Status: AC | PRN

## 2024-02-12 NOTE — Progress Notes (Signed)
 Elizabeth Garrison [id number] [birth year] 39 y.o.  Subjective:   Patient ID:  Elizabeth Garrison is a 39 y.o. (DOB 08-08-1985) female.  Chief Complaint:  Chief Complaint  Patient presents with   Follow-up   Depression   Anxiety    HPI Elizabeth Garrison presents to the office today for follow-up for routine medication management. Pt is transitioning care from [facility], NP,  with LOV on 01/16/2023.  Current Psych Medication Regimen: No SE reported. Zoloft  150 mg po at bedtime Xanax  .5 mg PRN for anxiety  Pt is doing well and stable on medication regimen. No medication changes at this time.   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 She reports having difficulty establishing healthy boundaries, which is working on strategies and though processing with therapist, Deane. Engaged in bi-monthly therapy, which she finds helpful. Next appointment - 2/19.  Denies depressive symptoms such as sadness, tearfulness, or hopelessness.  Energy and motivation are stable; denies anhedonia. Cognitive functions like concentration, decision-making, and memory are intact.   Anxiety symptoms are well controlled, however has difficulty relaxing at night due to racing thoughts related to ongoing stressors. She reports 1/2 tablet of .5mg  Xanax  has been helpful PRN. No panic attacks reported. Sleep is ok. Appetite is stable, with normal weight and intact ADLs and personal hygiene. Ongoing symptom monitoring continues. She would like to implement more holistic healing and eventually taper off medication in the distant future.  Denies mania, delirium, AVH, SI, HI or self-harm behaviors. No further complaints at this time.   GAD-7    Flowsheet Row Counselor from 03/07/2023 in [facility]  Total GAD-7 Score 20   PHQ2-9    Flowsheet Row Counselor from  03/07/2023 in [facility]  PHQ-2 Total Score 4  PHQ-9 Total Score 15   Flowsheet Row ED from 10/30/2021 in [facility] Emergency Department at [facility] ED from 08/31/2020 in [facility] Emergency Department at [facility]  C-SSRS RISK CATEGORY No Risk No Risk     Review of Systems:  Review of Systems  Constitutional:  Positive for fatigue.  Psychiatric/Behavioral:         Please refer to HPI.  All other systems reviewed and are negative.   Past medications for mental health diagnoses include: Prozac  Medications: I have reviewed the patient's current medications.  Current Outpatient Medications  Medication Sig Dispense Refill   albuterol (VENTOLIN HFA) 108 (90 Base) MCG/ACT inhaler Inhale into the lungs as needed.     ALPRAZolam  (XANAX ) 0.5 MG tablet Take 1 tablet (0.5 mg total) by mouth daily as needed for anxiety (Panic attack). 90 tablet 0   rosuvastatin  (CRESTOR ) 5 MG tablet Take 1 tablet (5 mg total) by mouth daily. 90 tablet 3   sertraline  (ZOLOFT ) 100 MG tablet Take 1.5 tablets (150 mg total) by mouth at bedtime. 45 tablet 0   No current facility-administered medications for this visit.    Medication Side Effects: None  Allergies: Allergies[1]  Past Medical History:  Diagnosis Date   Anxiety    as a teen   Asthma 02/08/11   H/O cystitis    H/O pyelonephritis    as adolescent   H/O varicella    History of kidney stones    age 39   Yeast infection     Past Medical History, Surgical history, Social history, and Family history  were reviewed and updated as appropriate.   Please see review of systems for further details on the patient's review from today.   Objective:   Physical Exam:  There were no vitals taken for this visit.  Physical Exam Constitutional:      Appearance: Normal appearance. She is normal weight.  Neurological:     General: No focal deficit present.     Mental Status: She is alert and  oriented to person, place, and time. Mental status is at baseline.  Psychiatric:        Attention and Perception: Attention and perception normal.        Mood and Affect: Mood and affect normal.        Speech: Speech normal.        Behavior: Behavior normal. Behavior is cooperative.        Thought Content: Thought content normal.        Cognition and Memory: Cognition and memory normal.        Judgment: Judgment normal.     Lab Review:     Component Value Date/Time   NA 140 10/30/2021 1107   K 3.7 10/30/2021 1107   CL 101 10/30/2021 1107   CO2 27 10/30/2021 1107   GLUCOSE 89 10/30/2021 1107   BUN 7 10/30/2021 1107   CREATININE 0.66 10/30/2021 1107   CALCIUM  9.7 10/30/2021 1107   PROT 7.5 10/30/2021 1107   PROT 7.0 04/04/2021 0950   ALBUMIN 4.8 10/30/2021 1107   ALBUMIN 4.9 (H) 04/04/2021 0950   AST 26 10/30/2021 1107   ALT 19 10/30/2021 1107   ALKPHOS 37 (L) 10/30/2021 1107   BILITOT 0.7 10/30/2021 1107   BILITOT 0.6 04/04/2021 0950   GFRNONAA >60 10/30/2021 1107   GFRAA >60 10/12/2016 1156       Component Value Date/Time   WBC 3.6 (L) 10/30/2021 1107   RBC 4.60 10/30/2021 1107   HGB 13.8 10/30/2021 1107   HCT 39.7 10/30/2021 1107   PLT 160 10/30/2021 1107   MCV 86.3 10/30/2021 1107   MCH 30.0 10/30/2021 1107   MCHC 34.8 10/30/2021 1107   RDW 11.9 10/30/2021 1107   LYMPHSABS 1.4 10/30/2021 1107   MONOABS 0.4 10/30/2021 1107   EOSABS 0.1 10/30/2021 1107   BASOSABS 0.0 10/30/2021 1107    No results found for: POCLITH, LITHIUM   No results found for: PHENYTOIN, PHENOBARB, VALPROATE, CBMZ   .res Assessment: Plan:    Elizabeth Garrison was seen today for follow-up, depression and anxiety.  Diagnoses and all orders for this visit:  Generalized anxiety disorder  Major depressive disorder, recurrent episode, moderate (HCC)  Mixed obsessional thoughts and acts     Please see After Visit Summary for patient specific instructions.  Future Appointments   Date Time Provider Department Center  02/27/2024  2:00 PM Burnetta Almarie DASEN [facility] CP-CP None  03/24/2024  9:00 AM Burnetta Almarie DASEN, [facility] CP-CP None  04/07/2024  9:00 AM Burnetta Almarie DASEN, [facility] CP-CP None  04/21/2024  9:00 AM Burnetta Almarie T, [facility] CP-CP None  05/05/2024  9:00 AM Burnetta Almarie DASEN, [facility] CP-CP None  05/13/2024  9:00 AM Cindie Rajagopalan, [place], PA-C CP-CP None    No orders of the defined types were placed in this encounter.   I provided approximately 30 minutes of face to face time during this encounter, including time spent before and after the visit in records review, medical decision making, counseling pertinent to today's visit, and charting.   Discussed dx and tx plan.  Discussed alternative options including therapy.   PDMP reviewed: low risk trend  Xanax  LF - 10/14  GAD/MDD/Mixed Obsessional thoughts and acts - controlled  Zoloft  150 mg po at bedtime Refilled 90 day supply Advised to keep a medication side effect journal to systematically track any symptoms, noting onset, frequency, severity, and context of side effects. Monitor SE including N, HA, insomnia or sleepiness, dry mouth, sweating, dizziness, sexual dysfunction, weight changes/appetite changes, or diarrhea/constipation.  Xanax  .5mg  PRN for anxiety; pt takes sparingly Refilled 90 day supply Discussed potential benefits, risk, and side effects of benzodiazepines to include potential risk of tolerance and dependence, as well as possible drowsiness.  Advised patient not to drive if experiencing drowsiness and to take lowest possible effective dose to minimize risk of dependence and tolerance.  Spent approximately 20 minutes providing education and counseling on therapeutic lifestyle modifications, sleep hygiene and relationship boundaries. Emphasizing the importance of regular exercise of at least 3x a week for 30 min, incorporating healthier diet options with a focus on balanced nutrition and  reduced processed foods, and considering appropriate supplementation including multi vitamin, vitamin d, b 12, magnesium  glycinate, iron to support overall well-being.  ----------- Discussed importance of setting and maintaining healthy boundaries to support emotional wellbeing and reduce interpersonal stress. Encouraged clear, assertive communication needs and limits in relationships will maintain respect for others. Reviewed strategies for identifying unhealthy or enmeshed patterns and responding appropriately. Advised patient reflecting current relationships, proper boundary setting skills, and bring challenges or progress to follow-up for review. ---------- Provided education sleep hygiene: Maintain a consistent sleep/wake schedule, limit naps, and optimize the bedroom to be dark, quiet, and cool. Advised avoiding caffeine,heavy meals, and/or nicotine/alcohol several hours before bedtime and reducing screen using other stimulating activities an hour before bed/ Recommended a relaxing free sleep routines and regular daytime physical activity while avoiding vigorous exercising at bedtime; patient to track sleep and bring concerns to follow-up  FOLLOW UP: 3 months or sooner if clinically indicated  Risks, benefits, and alternatives of the medications and treatment plan prescribed today were discussed. Patient engaged in shared decision-making;treatment plan reviewed and agreed upon.    Natanael Saladin PA-C, DMSc      [1] No Known Allergies

## 2024-02-27 ENCOUNTER — Ambulatory Visit: Admitting: Professional Counselor

## 2024-03-24 ENCOUNTER — Ambulatory Visit (INDEPENDENT_AMBULATORY_CARE_PROVIDER_SITE_OTHER): Admitting: Professional Counselor

## 2024-04-07 ENCOUNTER — Ambulatory Visit: Admitting: Professional Counselor

## 2024-04-21 ENCOUNTER — Ambulatory Visit: Admitting: Professional Counselor

## 2024-05-05 ENCOUNTER — Ambulatory Visit: Admitting: Professional Counselor

## 2024-05-13 ENCOUNTER — Ambulatory Visit: Admitting: Emergency Medicine
